# Patient Record
Sex: Female | Born: 1985 | Race: White | Hispanic: No | Marital: Married | Smoking: Never smoker
Health system: Southern US, Community
[De-identification: ages and names within clinical notes are randomized; demographics above are authoritative.]

## PROBLEM LIST (undated history)

## (undated) DIAGNOSIS — D069 Carcinoma in situ of cervix, unspecified: Secondary | ICD-10-CM

## (undated) HISTORY — DX: Carcinoma in situ of cervix, unspecified: D06.9

## (undated) HISTORY — PX: NO PAST SURGERIES: SHX2092

---
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
## 2007-04-15 ENCOUNTER — Emergency Department (HOSPITAL_COMMUNITY): Admission: EM | Admit: 2007-04-15 | Discharge: 2007-04-15 | Payer: Self-pay | Admitting: Family Medicine

## 2007-10-01 ENCOUNTER — Emergency Department: Payer: Self-pay | Admitting: Emergency Medicine

## 2007-10-01 ENCOUNTER — Other Ambulatory Visit: Payer: Self-pay

## 2008-07-29 ENCOUNTER — Emergency Department: Payer: Self-pay | Admitting: Emergency Medicine

## 2008-08-31 ENCOUNTER — Emergency Department: Payer: Self-pay | Admitting: Emergency Medicine

## 2008-11-15 ENCOUNTER — Emergency Department (HOSPITAL_COMMUNITY): Admission: EM | Admit: 2008-11-15 | Discharge: 2008-11-15 | Payer: Self-pay | Admitting: Family Medicine

## 2009-01-29 ENCOUNTER — Emergency Department (HOSPITAL_COMMUNITY): Admission: EM | Admit: 2009-01-29 | Discharge: 2009-01-30 | Payer: Self-pay | Admitting: Emergency Medicine

## 2009-05-30 ENCOUNTER — Inpatient Hospital Stay (HOSPITAL_COMMUNITY): Admission: AD | Admit: 2009-05-30 | Discharge: 2009-05-30 | Payer: Self-pay | Admitting: Obstetrics and Gynecology

## 2009-06-30 ENCOUNTER — Inpatient Hospital Stay (HOSPITAL_COMMUNITY): Admission: AD | Admit: 2009-06-30 | Discharge: 2009-07-02 | Payer: Self-pay | Admitting: Obstetrics and Gynecology

## 2009-07-30 ENCOUNTER — Emergency Department (HOSPITAL_COMMUNITY): Admission: EM | Admit: 2009-07-30 | Discharge: 2009-07-30 | Payer: Self-pay | Admitting: Family Medicine

## 2010-12-10 LAB — COMPREHENSIVE METABOLIC PANEL
AST: 23 U/L (ref 0–37)
BUN: 7 mg/dL (ref 6–23)
CO2: 24 mEq/L (ref 19–32)
Calcium: 9.4 mg/dL (ref 8.4–10.5)
Chloride: 105 mEq/L (ref 96–112)
Creatinine, Ser: 0.47 mg/dL (ref 0.4–1.2)
GFR calc Af Amer: >60 mL/min (ref >60)
GFR calc non Af Amer: >60 mL/min (ref >60)
Glucose, Bld: 98 mg/dL (ref 70–99)
Total Bilirubin: 0.7 mg/dL (ref 0.3–1.2)

## 2010-12-10 LAB — CBC
HCT: 37.2 % (ref 36.0–46.0)
MCHC: 33.4 g/dL (ref 30.0–36.0)
MCV: 88.6 fL (ref 78.0–100.0)
MCV: 88.8 fL (ref 78.0–100.0)
Platelets: 145 10*3/uL — ABNORMAL LOW (ref 150–400)
RBC: 3.17 MIL/uL — ABNORMAL LOW (ref 3.87–5.11)
RBC: 4.2 MIL/uL (ref 3.87–5.11)
WBC: 19.6 10*3/uL — ABNORMAL HIGH (ref 4.0–10.5)
WBC: 23 10*3/uL — ABNORMAL HIGH (ref 4.0–10.5)

## 2010-12-10 LAB — URIC ACID: Uric Acid, Serum: 4.4 mg/dL (ref 2.4–7.0)

## 2010-12-10 LAB — LACTATE DEHYDROGENASE: LDH: 99 U/L (ref 94–250)

## 2010-12-10 LAB — RPR: RPR Ser Ql: NONREACTIVE

## 2010-12-17 LAB — POCT PREGNANCY, URINE: Preg Test, Ur: POSITIVE

## 2011-05-22 ENCOUNTER — Inpatient Hospital Stay (INDEPENDENT_AMBULATORY_CARE_PROVIDER_SITE_OTHER)
Admission: RE | Admit: 2011-05-22 | Discharge: 2011-05-22 | Disposition: A | Discharge status: Home / Self Care | Payer: Self-pay | Source: Ambulatory Visit | Attending: Family Medicine | Admitting: Family Medicine

## 2011-05-22 DIAGNOSIS — N760 Acute vaginitis: Secondary | ICD-10-CM

## 2011-05-22 DIAGNOSIS — A499 Bacterial infection, unspecified: Secondary | ICD-10-CM

## 2011-05-22 DIAGNOSIS — N739 Female pelvic inflammatory disease, unspecified: Secondary | ICD-10-CM

## 2011-05-22 DIAGNOSIS — N390 Urinary tract infection, site not specified: Secondary | ICD-10-CM

## 2011-05-22 LAB — POCT URINALYSIS DIP (DEVICE)
Bilirubin Urine: NEGATIVE
Glucose, UA: NEGATIVE mg/dL
Ketones, ur: NEGATIVE mg/dL
Nitrite: NEGATIVE
pH: 7 (ref 5.0–8.0)

## 2011-05-22 LAB — WET PREP, GENITAL: Trich, Wet Prep: NONE SEEN

## 2011-05-22 LAB — POCT PREGNANCY, URINE: Preg Test, Ur: NEGATIVE

## 2011-05-23 LAB — URINE CULTURE: Culture: NO GROWTH

## 2011-05-24 LAB — GC/CHLAMYDIA PROBE AMP, GENITAL: GC Probe Amp, Genital: NEGATIVE

## 2011-06-21 LAB — POCT RAPID STREP A: Streptococcus, Group A Screen (Direct): NEGATIVE

## 2014-10-01 ENCOUNTER — Encounter (HOSPITAL_COMMUNITY): Payer: Self-pay | Admitting: *Deleted

## 2014-10-01 ENCOUNTER — Emergency Department (HOSPITAL_COMMUNITY)
Admission: EM | Admit: 2014-10-01 | Discharge: 2014-10-01 | Disposition: A | Discharge status: Home / Self Care | Payer: No Typology Code available for payment source | Source: Home / Self Care | Attending: Emergency Medicine | Admitting: Emergency Medicine

## 2014-10-01 DIAGNOSIS — B179 Acute viral hepatitis, unspecified: Secondary | ICD-10-CM

## 2014-10-01 DIAGNOSIS — K7200 Acute and subacute hepatic failure without coma: Secondary | ICD-10-CM

## 2014-10-01 LAB — CBC WITH DIFFERENTIAL/PLATELET
BASOS ABS: 0.2 10*3/uL — AB (ref 0.0–0.1)
BASOS PCT: 3 % — AB (ref 0–1)
EOS PCT: 1 % (ref 0–5)
Eosinophils Absolute: 0.1 10*3/uL (ref 0.0–0.7)
HCT: 32.5 % — ABNORMAL LOW (ref 36.0–46.0)
Hemoglobin: 11.1 g/dL — ABNORMAL LOW (ref 12.0–15.0)
Lymphocytes Relative: 51 % — ABNORMAL HIGH (ref 12–46)
Lymphs Abs: 3.6 10*3/uL (ref 0.7–4.0)
MCH: 27.8 pg (ref 26.0–34.0)
MCHC: 34.2 g/dL (ref 30.0–36.0)
MCV: 81.5 fL (ref 78.0–100.0)
MONOS PCT: 11 % (ref 3–12)
Monocytes Absolute: 0.8 10*3/uL (ref 0.1–1.0)
NEUTROS ABS: 2.4 10*3/uL (ref 1.7–7.7)
NEUTROS PCT: 34 % — AB (ref 43–77)
Platelets: 139 10*3/uL — ABNORMAL LOW (ref 150–400)
RBC: 3.99 MIL/uL (ref 3.87–5.11)
RDW: 14.6 % (ref 11.5–15.5)
WBC: 7.1 10*3/uL (ref 4.0–10.5)

## 2014-10-01 LAB — COMPREHENSIVE METABOLIC PANEL
ALK PHOS: 147 U/L — AB (ref 39–117)
ALT: 209 U/L — AB (ref 0–35)
ANION GAP: 12 (ref 5–15)
AST: 142 U/L — ABNORMAL HIGH (ref 0–37)
Albumin: 3.7 g/dL (ref 3.5–5.2)
BILIRUBIN TOTAL: 6.3 mg/dL — AB (ref 0.3–1.2)
BUN: 6 mg/dL (ref 6–23)
CO2: 26 mmol/L (ref 19–32)
CREATININE: 0.64 mg/dL (ref 0.50–1.10)
Calcium: 9.3 mg/dL (ref 8.4–10.5)
Chloride: 98 mmol/L (ref 96–112)
GFR calc Af Amer: >90 mL/min (ref >90)
Glucose, Bld: 90 mg/dL (ref 70–99)
POTASSIUM: 2.8 mmol/L — AB (ref 3.5–5.1)
SODIUM: 136 mmol/L (ref 135–145)
TOTAL PROTEIN: 7.5 g/dL (ref 6.0–8.3)

## 2014-10-01 LAB — POCT URINALYSIS DIP (DEVICE)
Glucose, UA: 100 mg/dL — AB
KETONES UR: 15 mg/dL — AB
Nitrite: NEGATIVE
PH: 5.5 (ref 5.0–8.0)
Protein, ur: 30 mg/dL — AB
SPECIFIC GRAVITY, URINE: 1.02 (ref 1.005–1.030)

## 2014-10-01 LAB — POCT INFECTIOUS MONO SCREEN: Mono Screen: NEGATIVE

## 2014-10-01 LAB — POCT PREGNANCY, URINE: PREG TEST UR: NEGATIVE

## 2014-10-01 NOTE — Discharge Instructions (Signed)
Viral Hepatitis  Hepatitis is an inflammation of the liver. It can be caused by many different things, including several different viruses. These viruses are named hepatitis A, B, C, D, and E. All these viruses can cause short-term (acute) hepatitis. The hepatitis B, C, and D viruses can also cause long-standing (chronic) hepatitis. Chronic hepatitis infection is prolonged and sometimes lifelong. Other viruses may also cause hepatitis but have not yet been discovered.  SYMPTOMS  Some people have no symptoms. Others may have:  · Fatigue.  · Abdominal pain.  · Loss of appetite.  · Nausea.  · Vomiting.  · Low-grade fever.  · Yellowing of the skin (jaundice).  HEPATITIS A  Disease Spread  Primarily through food or water contaminated by feces from an infected person.  People at Risk  · Travelers to any area of the world with poor sanitation.  · People living in areas where hepatitis A outbreaks are common.  · People who live with or have close contact with an infected person.  · During outbreaks:  ¨ Daycare children and employees.  ¨ Men who have sex with men.  ¨ Injection drug users.  ¨ People who eat raw or undercooked shellfish (oysters, clams, mussels).  ¨ People who live or work in institutions.  Prevention  · Receive the hepatitis A vaccine.  · Avoid tap water, ice cubes made from tap water, and uncooked or inadequately cooked foods when traveling to areas with poor sanitation.  · Avoid eating raw or undercooked shellfish.  · Practice good hygiene and sanitation.  Treatment  Hepatitis A usually resolves on its own over several weeks.  HEPATITIS B  Disease Spread  · Through contact with infected blood.  · Through sex with an infected person.  · From mother to child during childbirth.  People at Risk  · People who have sex with an infected person.  · Men who have sex with men.  · Injection drug users.  · Children of immigrants from disease-endemic areas.  · Infants born to infected mothers.  · People who live with an  infected person and are exposed to that person's blood.  · Healthcare workers exposed to blood.  · Hemodialysis patients.  · People who received a transfusion of blood or blood products before July 1992 or clotting factors made before 1987.  Prevention  · Receive the hepatitis B vaccine.  · Healthcare workers need to avoid injuries and wear appropriate protective equipment such as gloves, gowns, and face masks when performing invasive medical or nursing procedures.  · After exposure to infectious blood, if you were not previously and successfully vaccinated, receive a gamma globulin shot (HBIG), hepatitis B vaccine, or both.  Treatment  Acute hepatitis B usually resolves on its own. For chronic hepatitis B, drug treatment is available and advised for many, but not all patients. All patients who have chronic hepatitis B infection should be carefully monitored by a caregiver over time.   HEPATITIS C  Disease Spread  · Through contact with infected blood.  · Through sex with an infected person.  · From mother to child during childbirth.  People at Risk  · Injection drug users.  · People who have sex with an infected person.  · People who have multiple sex partners.  · Healthcare workers exposed to blood.  · Infants born to infected mothers.  · Hemodialysis patients.  · People who received a transfusion of blood or blood products before July 1992 or clotting factors made   before 1987.  · People born between 1945 and 1965.  Prevention  · There is no vaccine for hepatitis C. The only way to prevent the disease is to reduce the risk of exposure to blood that is contaminated with the virus. This means avoiding behaviors like sharing drug needles or sharing personal items like toothbrushes, razors, and nail clippers with an infected person.  · Healthcare workers need to avoid injuries and wear appropriate protective equipment, such as gloves, gowns, and face masks when performing invasive medical or nursing  procedures.  Treatment  For acute hepatitis C, treatment may be recommended if it does not resolve within 2 to 3 months. For chronic hepatitis C, drug treatment is available and advised for many, but not all patients. All patients who have chronic hepatitis C infection should be carefully monitored by a caregiver over time.  HEPATITIS D  Disease Spread  Through contact with infected blood. This disease happens only in people who are already infected with hepatitis B or who become infected with hepatitis B and hepatitis D at the same time.  People at Risk  Anyone infected with hepatitis B. Injection drug users who have hepatitis B have the highest risk. People who have hepatitis B are also at risk if they have sex with a person infected with hepatitis D or if they live with an infected person. Also at risk are people who received a transfusion of blood or blood products before July 1992 or clotting factors made before 1987.  Prevention  · Receive the hepatitis B vaccine if you are not already infected.  · Avoid exposure to infected blood.  · Avoid exposure to contaminated needles.  · Avoid exposure to an infected person's personal items (toothbrush, razor, nail clippers).  Treatment  The combination of hepatitis D and hepatitis B infection usually causes very serious and progressive liver disease. Because of this, drug treatment is almost always recommended. Treatment regimens are the same as those recommended for the hepatitis B infection.  HEPATITIS E  Disease Spread  Through food or water contaminated by feces from an infected person. This disease is common in Asia, Africa, the Middle [place], and [place].   People at Risk  · Travelers to areas of the world where hepatitis E infection is common.  · People living in areas where hepatitis E outbreaks are common.  · People who live with an infected person.  Prevention  A vaccine for hepatitis E is not yet available. Currently, the only way to prevent the disease  is to reduce the risk of exposure to the virus. This means avoiding tap water, ice cubes made from tap water, and uncooked or inadequately cooked foods when traveling to hepatitis E endemic areas with poor sanitation.   Treatment  Hepatitis E usually resolves on its own over several weeks to months.  OTHER CAUSES OF VIRAL HEPATITIS  Some cases of viral hepatitis cannot be attributed to the hepatitis A, B, C, D, or E viruses. This is called non A-E hepatitis. Scientists continue to study the causes of non A-E hepatitis.  Document Released: 10/15/2004 Document Revised: 11/15/2011 Document Reviewed: 12/24/2010  ExitCare® Patient Information ©2015 [employer]. This information is not intended to replace advice given to you by your health care provider. Make sure you discuss any questions you have with your health care provider.
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
## 2014-10-01 NOTE — ED Provider Notes (Signed)
Chief Complaint   Urinary Tract Infection   History of Present Illness   Stephania FragminKasey L [name] is a [age] year old female who presents with lower back and lower abdominal pain with nausea since yesterday. Her urine is tea-colored right now has gotten progressively darker over the past week. She denies any dysuria, frequency, or urgency. She's had no fever, chills, sore throat, swollen glands, vomiting, or change in color of her stools. She denies any sick exposures. Denies any ingestion of raw foods. She works as a LawyerCNA. She's not gotten any needle sticks or bodily fluid exposures recently. She is sexually active. Her boyfriend has not been sick. No recent foreign travel. She has gotten her hepatitis B vaccines.  Review of Systems   Other than as noted above, the patient denies any of the following symptoms: Constitutional:  No fever, chills, weight loss or anorexia. Abdomen:  No nausea, vomiting, hematememesis, melena, diarrhea, or hematochezia. GU:  No dysuria, frequency, urgency, or hematuria. Gyn:  No vaginal discharge, itching, abnormal bleeding, dyspareunia, or pelvic pain.  PMFSH   Past medical history, family history, social history, meds, and allergies were reviewed. She has been on birth control pills since late November.  Physical Exam     Vital signs:  BP 108/64 mmHg  Pulse 105  Temp(Src) 99.7 F (37.6 C) (Oral)  Resp 18  SpO2 98%  LMP 09/19/2014 Gen:  Alert, oriented, in no distress. Eyes: Sclerae are icteric. ENT: Pharynx is clear, no cervical adenopathy. Lungs:  Breath sounds clear and equal bilaterally.  No wheezes, rales or rhonchi. Heart:  Regular rhythm.  No gallops or murmers.   Abdomen:  Soft, nontender, no organomegaly or mass. Bowel sounds are normally active. Skin:  Clear, warm and dry.  No rash.  Labs   Results for orders placed or performed during the hospital encounter of 10/01/14  POCT urinalysis dip (device)  Result Value Ref Range   Glucose, UA 100 (A)  NEGATIVE mg/dL   Bilirubin Urine LARGE (A) NEGATIVE   Ketones, ur 15 (A) NEGATIVE mg/dL   Specific Gravity, Urine 1.020 1.005 - 1.030   Hgb urine dipstick TRACE (A) NEGATIVE   pH 5.5 5.0 - 8.0   Protein, ur 30 (A) NEGATIVE mg/dL   Urobilinogen, UA >=7.4>=8.0 0.0 - 1.0 mg/dL   Nitrite NEGATIVE NEGATIVE   Leukocytes, UA TRACE (A) NEGATIVE  Pregnancy, urine POC  Result Value Ref Range   Preg Test, Ur NEGATIVE NEGATIVE   Acute hepatitis serologies obtained as well as complete metabolic panel, CBC, HIV, RPR, Monospot, and urine culture. Will call patient back with results as they come in.  Assessment   The encounter diagnosis was Acute hepatitis.  Nothing to suggest urinary tract infection. Only has trace RBCs and leukocytes on her urine. Also no urinary symptoms. She has nothing to suggest mononucleosis. She's been immunized to hepatitis B, so she should not have that. No suspicious exposures for hepatitis C. Could be hepatitis A, or possibly drug-related, secondary to birth control pills. We'll see what the results of her blood work shows, have her come back again on Friday, and thereafter will need to see gastroenterology for long-term follow-up. Was told to stop birth control pills in the meantime.  Plan     1.  Meds:  The following meds were prescribed:   New Prescriptions   No medications on file    2.  Patient Education/Counseling:  The patient was given appropriate handouts, self care instructions, and instructed in symptomatic  relief.  Suggested she take infectious precautions until we know what type of hepatitis were dealing with.  3.  Follow up:  The patient was told to follow up here for a scheduled recheck in 3 days, or sooner if becoming worse in any way, and given some red flag symptoms such as worsening pain, fever, vomiting, or evidence of GI bleeding which would prompt immediate return. After her follow-up here, she'll need to see gastroenterology for long-term  follow-up.    Reuben Likes, MD 10/01/14 2001

## 2014-10-01 NOTE — ED Notes (Signed)
C/o low back and low abdominal pain and nausea onset yesterday.  Urine tea colored now- got progressively darker over the past week.  No frequency in small amounts.  No dysuria or urgency.

## 2014-10-02 ENCOUNTER — Telehealth (HOSPITAL_COMMUNITY): Payer: Self-pay | Admitting: Emergency Medicine

## 2014-10-02 LAB — HEPATITIS PANEL, ACUTE
HCV Ab: NEGATIVE
HEP A IGM: NONREACTIVE
HEP B S AG: NEGATIVE
Hep B C IgM: NONREACTIVE

## 2014-10-02 LAB — PATHOLOGIST SMEAR REVIEW

## 2014-10-02 MED ORDER — POTASSIUM CHLORIDE CRYS ER 20 MEQ PO TBCR: 20.0000 meq | EXTENDED_RELEASE_TABLET | Freq: Two times a day (BID) | ORAL | Status: DC

## 2014-10-02 NOTE — ED Notes (Signed)
Her LFTs were elevated, K was 2.8, and hepatitis serologies were negative.  Patient was informed of these results.  Suggested she remain off of BCPs, follow up on Friday, and get extra dietary K.  Will send in Rx for K-Dur.  Reuben Likesavid C Calie Buttrey, MD 10/02/14 [phone]

## 2014-10-03 ENCOUNTER — Telehealth (HOSPITAL_COMMUNITY): Payer: Self-pay | Admitting: *Deleted

## 2014-10-03 ENCOUNTER — Emergency Department (HOSPITAL_COMMUNITY)
Admission: EM | Admit: 2014-10-03 | Discharge: 2014-10-03 | Disposition: A | Discharge status: Home / Self Care | Payer: No Typology Code available for payment source | Attending: Emergency Medicine | Admitting: Emergency Medicine

## 2014-10-03 ENCOUNTER — Emergency Department (HOSPITAL_COMMUNITY): Payer: No Typology Code available for payment source

## 2014-10-03 ENCOUNTER — Encounter (HOSPITAL_COMMUNITY): Payer: Self-pay | Admitting: Emergency Medicine

## 2014-10-03 DIAGNOSIS — R7401 Elevation of levels of liver transaminase levels: Secondary | ICD-10-CM

## 2014-10-03 DIAGNOSIS — R740 Nonspecific elevation of levels of transaminase and lactic acid dehydrogenase [LDH]: Secondary | ICD-10-CM | POA: Insufficient documentation

## 2014-10-03 DIAGNOSIS — Z79899 Other long term (current) drug therapy: Secondary | ICD-10-CM | POA: Insufficient documentation

## 2014-10-03 DIAGNOSIS — Z3202 Encounter for pregnancy test, result negative: Secondary | ICD-10-CM | POA: Diagnosis not present

## 2014-10-03 DIAGNOSIS — R17 Unspecified jaundice: Secondary | ICD-10-CM | POA: Diagnosis not present

## 2014-10-03 LAB — URINALYSIS, ROUTINE W REFLEX MICROSCOPIC
GLUCOSE, UA: NEGATIVE mg/dL
KETONES UR: 15 mg/dL — AB
Nitrite: NEGATIVE
Protein, ur: NEGATIVE mg/dL
SPECIFIC GRAVITY, URINE: 1.014 (ref 1.005–1.030)
Urobilinogen, UA: >8 mg/dL — ABNORMAL HIGH (ref 0.0–1.0)
pH: 5.5 (ref 5.0–8.0)

## 2014-10-03 LAB — COMPREHENSIVE METABOLIC PANEL
ALBUMIN: 3.6 g/dL (ref 3.5–5.2)
ALK PHOS: 154 U/L — AB (ref 39–117)
ALT: 235 U/L — AB (ref 0–35)
ANION GAP: 9 (ref 5–15)
AST: 148 U/L — AB (ref 0–37)
BUN: 6 mg/dL (ref 6–23)
CHLORIDE: 100 mmol/L (ref 96–112)
CO2: 27 mmol/L (ref 19–32)
Calcium: 9.2 mg/dL (ref 8.4–10.5)
Creatinine, Ser: 0.55 mg/dL (ref 0.50–1.10)
GFR calc Af Amer: >90 mL/min (ref >90)
GLUCOSE: 114 mg/dL — AB (ref 70–99)
POTASSIUM: 4 mmol/L (ref 3.5–5.1)
SODIUM: 136 mmol/L (ref 135–145)
Total Bilirubin: 5.2 mg/dL — ABNORMAL HIGH (ref 0.3–1.2)
Total Protein: 7.6 g/dL (ref 6.0–8.3)

## 2014-10-03 LAB — URINE CULTURE
COLONY COUNT: NO GROWTH
Culture: NO GROWTH
Special Requests: NORMAL

## 2014-10-03 LAB — CBC WITH DIFFERENTIAL/PLATELET
BASOS PCT: 4 % — AB (ref 0–1)
Basophils Absolute: 0.3 10*3/uL — ABNORMAL HIGH (ref 0.0–0.1)
Eosinophils Absolute: 0 10*3/uL (ref 0.0–0.7)
Eosinophils Relative: 0 % (ref 0–5)
HEMATOCRIT: 34.9 % — AB (ref 36.0–46.0)
Hemoglobin: 11.8 g/dL — ABNORMAL LOW (ref 12.0–15.0)
LYMPHS ABS: 4.9 10*3/uL — AB (ref 0.7–4.0)
Lymphocytes Relative: 68 % — ABNORMAL HIGH (ref 12–46)
MCH: 28.3 pg (ref 26.0–34.0)
MCHC: 33.8 g/dL (ref 30.0–36.0)
MCV: 83.7 fL (ref 78.0–100.0)
MONOS PCT: 9 % (ref 3–12)
Monocytes Absolute: 0.6 10*3/uL (ref 0.1–1.0)
Neutro Abs: 1.3 10*3/uL — ABNORMAL LOW (ref 1.7–7.7)
Neutrophils Relative %: 19 % — ABNORMAL LOW (ref 43–77)
PLATELETS: 152 10*3/uL (ref 150–400)
RBC: 4.17 MIL/uL (ref 3.87–5.11)
RDW: 14.8 % (ref 11.5–15.5)
WBC: 7.1 10*3/uL (ref 4.0–10.5)

## 2014-10-03 LAB — URINE MICROSCOPIC-ADD ON

## 2014-10-03 LAB — PROTIME-INR
INR: 0.95 (ref 0.00–1.49)
PROTHROMBIN TIME: 12.8 s (ref 11.6–15.2)

## 2014-10-03 LAB — CK TOTAL AND CKMB (NOT AT ARMC)
CK TOTAL: 29 U/L (ref 7–177)
CK, MB: 0.4 ng/mL (ref 0.3–4.0)
RELATIVE INDEX: INVALID (ref 0.0–2.5)

## 2014-10-03 LAB — POC URINE PREG, ED: PREG TEST UR: NEGATIVE

## 2014-10-03 NOTE — Discharge Instructions (Signed)
Please call tomorrow morning for an appointment [facility] for the Reasons we discussed (worsening abdominal pain, inability to hold down foods or fluids, altered mental status) Please avoid drinking any alcohol or using Tylenol. During this time.  Jaundice Jaundice is a yellowish discoloration of the skin, whites of the eyes, and mucous membranes. It is caused by increased levels of bilirubin in the blood (hyperbilirubinemia). Bilirubin is produced by the normal breakdown of red blood cells. Jaundice may mean the liver or bile system is not working normally. CAUSES  The most common causes include:  Viral hepatitis.  Gallstones.  Excess use of alcohol.  Liver disease.  Certain cancers. SYMPTOMS   Yellow color to the skin, whites of the eyes, or mucous membranes.  Dark brown colored urine.  Stomach pain.  Light or clay colored stool.  Itchy skin. DIAGNOSIS   Your history will be taken along with a physical exam.  Urine and blood tests.  Abdominal ultrasound.  CT scans.  MRI.  Liver biopsy if the liver disease is suspected.  Endoscopic retrograde cholangiopancreatography (ERCP). TREATMENT  Treatment depends on the cause or related to the treatment of an underlying condition. For example, if jaundice is caused by gallstones, the stones or gallbladder may need to be removed. Other treatments may include:  Rest.  Stopping a certain medicine if it is causing the jaundice.  Giving fluid through the vein (IV fluids).  Surgery (removing gallstones, cancers). Some conditions that cause jaundice can be fatal if not treated. HOME CARE INSTRUCTIONS   Rest.  Drink enough fluids to keep your urine clear or pale yellow.  Avoid all alcoholic drinks.  Only take over-the-counter or prescription medicines for nausea, vomiting, itching, pain, discomfort, or fever as directed by your caregiver.  If jaundice is due to viral hepatitis or an  infection:  Avoid close contact with people.  Avoid preparing food for others.  Avoid sharing utensils with others.  Wash your hands often.  Keep all follow-up appointments with your caregiver.  Use skin lotions to relieve itching. SEEK IMMEDIATE MEDICAL CARE IF:   You have increased pain.  You have repeated vomiting.  You become dehydrated.  You have a fever or persistent symptoms for more than 72 hours.  You have a fever and your symptoms suddenly get worse.  You become weak or confused.  You develop a severe headache. MAKE SURE YOU:   Understand these instructions.  Will watch your condition.  Will get help right away if you are not doing well or get worse. Document Released: 08/23/2005 Document Revised: 11/15/2011 Document Reviewed: 08/07/2010 [facility] Patient Information 2015 [employer] [place]. This information is not intended to replace advice given to you by your health care provider. Make sure you discuss any questions you have with your health care provider.

## 2014-10-03 NOTE — ED Notes (Signed)
Pt sts increasing jaundice; pt denies other complaint at present; [facility]

## 2014-10-03 NOTE — ED Notes (Signed)
Pt states that she started a new birth control at the end of November. States that the Dr at urgent care believes the jaundice is from the birth control and instructed her to stop taking it.
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
## 2014-10-03 NOTE — ED Provider Notes (Signed)
CSN: [id number]     Arrival date & time 10/03/14  1743 History   First MD Initiated Contact with Patient 10/03/14 1820     Chief Complaint  Patient presents with  . Jaundice     (Consider location/radiation/quality/duration/timing/severity/associated sxs/prior Treatment) HPI  This is a 29 year old female who presents emergency department for jaundice. Patient was seen at the urgent care on 10/01/2014. She was found to have elevated LFTs, elevated urobilinogen in her urine, and potassium of 2.8.  Patient was asked to discontinue her oral contraceptive pills. She was brought in by her family members tonight because of worsening jaundice. She states her symptoms began one week ago when her urine turned dark. 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 The patient states that this past Tuesday she went to work all day and when she got off states that she just felt generalized malaise, body aches, abdominal pain and nausea. She denies any vomiting, change in stool colors, diarrhea. Her abdominal pain was in the left lower quadrant that has resolved. The patient has no known risk factors for hepatitis and her hepatitis panels were negative. She denies any current abdominal pain or alcohol abuse. Denies fevers, chills, myalgias, arthralgias. Denies DOE, SOB, chest tightness or pressure, radiation to left arm, jaw or back, or diaphoresis. Denies dysuria, flank pain, suprapubic pain, frequency, urgency. Denies headaches, light headedness, weakness, visual disturbances. Denies abdominal pain, nausea, vomiting, diarrhea or constipation.   History reviewed. No pertinent past medical history. History reviewed. No pertinent past surgical history. History reviewed. No pertinent family history. History  Substance Use Topics  . Smoking status: Never Smoker   . Smokeless tobacco: Not on file  . Alcohol Use: Yes     Comment:  occasional   OB History    No data available     Review of Systems  Ten systems reviewed and are negative for acute change, except as noted in the HPI.    Allergies  Review of patient's allergies indicates no known allergies.  Home Medications   Prior to Admission medications   Medication Sig Start Date End Date Taking? Authorizing Provider  potassium chloride SA (K-DUR,KLOR-CON) 20 MEQ tablet Take 1 tablet (20 mEq total) by mouth 2 (two) times daily. 10/02/14   Reuben Likes, MD   BP 117/69 mmHg  Pulse 84  Temp(Src) 98.3 F (36.8 C) (Oral)  Resp 16  SpO2 99%  LMP 09/19/2014 Physical Exam  Constitutional: She is oriented to person, place, and time. She appears well-developed and well-nourished. No distress.  HENT:  Head: Normocephalic and atraumatic.  Eyes: Conjunctivae are normal. Scleral icterus is present.  Neck: Normal range of motion.  Cardiovascular: Normal rate, regular rhythm and normal heart sounds.  Exam reveals no gallop and no friction rub.   No murmur heard. Pulmonary/Chest: Effort normal and breath sounds normal. No respiratory distress.  Abdominal: Soft. Bowel sounds are normal. She exhibits no distension and no mass. There is no tenderness. There is no guarding.  Neurological: She is alert and oriented to person, place, and time.  Skin: Skin is warm and dry. She is not diaphoretic.  Jaundiced skin    ED Course  Procedures (including critical care time) Labs Review Labs Reviewed  CBC WITH DIFFERENTIAL/PLATELET - Abnormal; Notable for the following:    Hemoglobin 11.8 (*)    HCT 34.9 (*)    Neutrophils Relative % 19 (*)  Lymphocytes Relative 68 (*)    Basophils Relative 4 (*)    Neutro Abs 1.3 (*)    Lymphs Abs 4.9 (*)    Basophils Absolute 0.3 (*)    All other components within normal limits  COMPREHENSIVE METABOLIC PANEL - Abnormal; Notable for the following:    Glucose, Bld 114 (*)    AST 148 (*)    ALT 235 (*)    Alkaline Phosphatase 154  (*)    Total Bilirubin 5.2 (*)    All other components within normal limits  URINALYSIS, ROUTINE W REFLEX MICROSCOPIC - Abnormal; Notable for the following:    Color, Urine ORANGE (*)    Hgb urine dipstick MODERATE (*)    Bilirubin Urine LARGE (*)    Ketones, ur 15 (*)    Urobilinogen, UA >8.0 (*)    Leukocytes, UA SMALL (*)    All other components within normal limits  CK TOTAL AND CKMB  URINE MICROSCOPIC-ADD ON  PROTIME-INR  POC URINE PREG, ED    Imaging Review No results found.   EKG Interpretation None      MDM   Final diagnoses:  Jaundice  Transaminitis     Patient here with jaundice, known elevated liver enzymes. Will reorder lab tests. Will repeat lab work and evaluate for worsening liver enzymes. RUQ US ordered. Negative hep panel. DDX includes choledocholithiasis, mass lesion, autoimmune hepatitis. Patient is without pain or nausea at this time.    Patient with bilirubinuria, negative ABD US. Her liver enzymes appears stable. PT/INR normal.  I have spoken with Dr. Arlyce DiceKaplan who will see the patient in follow up in his office. She will have repeat testing and further work up. No emergent MRCP needed. I have discussed lab/imaging findings and discharge plan with the patient as well as reasons to return to the ED.  The patient appears reasonably screened and/or stabilized for discharge and I doubt any other medical condition or other [facility] requiring further screening, evaluation, or treatment in the ED at this time prior to discharge.   Arthor Captainbigail Arick Mareno, PA-C 10/14/14 1100  Richardean Canalavid H Yao, MD 10/15/14 [phone]

## 2014-10-04 ENCOUNTER — Encounter (HOSPITAL_COMMUNITY): Payer: Self-pay

## 2014-10-04 ENCOUNTER — Emergency Department (HOSPITAL_COMMUNITY)
Admission: EM | Admit: 2014-10-04 | Discharge: 2014-10-04 | Disposition: A | Discharge status: Home / Self Care | Payer: No Typology Code available for payment source | Attending: Emergency Medicine | Admitting: Emergency Medicine

## 2014-10-04 DIAGNOSIS — N939 Abnormal uterine and vaginal bleeding, unspecified: Secondary | ICD-10-CM

## 2014-10-04 DIAGNOSIS — K759 Inflammatory liver disease, unspecified: Secondary | ICD-10-CM | POA: Diagnosis not present

## 2014-10-04 DIAGNOSIS — Z79899 Other long term (current) drug therapy: Secondary | ICD-10-CM | POA: Insufficient documentation

## 2014-10-04 DIAGNOSIS — N938 Other specified abnormal uterine and vaginal bleeding: Secondary | ICD-10-CM | POA: Diagnosis present

## 2014-10-04 LAB — COMPREHENSIVE METABOLIC PANEL
ALBUMIN: 3.5 g/dL (ref 3.5–5.2)
ALT: 251 U/L — ABNORMAL HIGH (ref 0–35)
AST: 152 U/L — ABNORMAL HIGH (ref 0–37)
Alkaline Phosphatase: 161 U/L — ABNORMAL HIGH (ref 39–117)
Anion gap: 4 — ABNORMAL LOW (ref 5–15)
BILIRUBIN TOTAL: 4.2 mg/dL — AB (ref 0.3–1.2)
BUN: 6 mg/dL (ref 6–23)
CALCIUM: 9.5 mg/dL (ref 8.4–10.5)
CO2: 31 mmol/L (ref 19–32)
Chloride: 102 mmol/L (ref 96–112)
Creatinine, Ser: 0.66 mg/dL (ref 0.50–1.10)
GFR calc Af Amer: >90 mL/min (ref >90)
GFR calc non Af Amer: >90 mL/min (ref >90)
GLUCOSE: 132 mg/dL — AB (ref 70–99)
Potassium: 4.2 mmol/L (ref 3.5–5.1)
SODIUM: 137 mmol/L (ref 135–145)
TOTAL PROTEIN: 7.4 g/dL (ref 6.0–8.3)

## 2014-10-04 LAB — RPR: RPR: NONREACTIVE

## 2014-10-04 LAB — CBC
HCT: 35.2 % — ABNORMAL LOW (ref 36.0–46.0)
HEMOGLOBIN: 11.7 g/dL — AB (ref 12.0–15.0)
MCH: 27.7 pg (ref 26.0–34.0)
MCHC: 33.2 g/dL (ref 30.0–36.0)
MCV: 83.2 fL (ref 78.0–100.0)
Platelets: 155 10*3/uL (ref 150–400)
RBC: 4.23 MIL/uL (ref 3.87–5.11)
RDW: 15.3 % (ref 11.5–15.5)
WBC: 7.3 10*3/uL (ref 4.0–10.5)

## 2014-10-04 LAB — ABO/RH: ABO/RH(D): B POS

## 2014-10-04 LAB — TYPE AND SCREEN
ABO/RH(D): B POS
Antibody Screen: NEGATIVE

## 2014-10-04 LAB — HIV ANTIBODY (ROUTINE TESTING W REFLEX): HIV Screen 4th Generation wRfx: NONREACTIVE

## 2014-10-04 NOTE — ED Notes (Signed)
Pt comfortable with discharge and follow up instructions. Pt declines wheelchair, escorted to waiting area by this RN. No prescriptions. 

## 2014-10-04 NOTE — ED Notes (Signed)
Pt was seen last night for vaginal bleeding and today noticed some blood with her stool. Has pain to left lower abd.

## 2014-10-04 NOTE — ED Provider Notes (Signed)
CSN: [id number]     Arrival date & time 10/04/14  1439 History   First MD Initiated Contact with Patient 10/04/14 1653     Chief Complaint  Patient presents with  . Vaginal Bleeding     HPI Patient presents with vaginal bleeding which started the last 24 hours.  Recently been seen and diagnosed with elevated LFTs.  She has been point sig gastroenterologist on Monday.  Patient was supposed to stop her birth control pills 2 days ago which she did.  Patient denies any nausea vomiting or diarrhea.  Patient does have jaundice and icterus but this was present yesterday during her visit.  She has had no recent needle stick.  Has not had recent use of Tylenol.  Patient did have hepatitis panels done recently which were negative.  No one else in her family is ill.  Patient states her bleeding started yesterday and feels like a normal period. History reviewed. No pertinent past medical history. History reviewed. No pertinent past surgical history. No family history on file. History  Substance Use Topics  . Smoking status: Never Smoker   . Smokeless tobacco: Not on file  . Alcohol Use: Yes     Comment: occasional   OB History    No data available     Review of Systems All other systems reviewed and are negative   Allergies  Review of patient's allergies indicates no known allergies.  Home Medications   Prior to Admission medications   Medication Sig Start Date End Date Taking? Authorizing Provider  potassium chloride SA (K-DUR,KLOR-CON) 20 MEQ tablet Take 1 tablet (20 mEq total) by mouth 2 (two) times daily. 10/02/14   Reuben Likesavid C Keller, MD   BP 102/61 mmHg  Pulse 95  Temp(Src) 98 F (36.7 C)  Resp 16  SpO2 100%  LMP 09/19/2014 Physical Exam  Constitutional: She is oriented to person, place, and time. She appears well-developed and well-nourished. No distress.  HENT:  Head: Normocephalic and atraumatic.  Eyes: Pupils are equal, round, and reactive to light.  Neck: Normal range of  motion.  Cardiovascular: Normal rate and intact distal pulses.   Pulmonary/Chest: No respiratory distress.  Abdominal: Normal appearance. She exhibits no distension.  Genitourinary: There is bleeding in the vagina. No tenderness in the vagina. No vaginal discharge found.  Musculoskeletal: Normal range of motion.  Neurological: She is alert and oriented to person, place, and time. No cranial nerve deficit.  Skin: Skin is warm and dry. No rash noted.  Psychiatric: She has a normal mood and affect. Her behavior is normal.  Nursing note and vitals reviewed.   ED Course  Procedures (including critical care time) Labs Review Labs Reviewed  CBC - Abnormal; Notable for the following:    Hemoglobin 11.7 (*)    HCT 35.2 (*)    All other components within normal limits  COMPREHENSIVE METABOLIC PANEL - Abnormal; Notable for the following:    Glucose, Bld 132 (*)    AST 152 (*)    ALT 251 (*)    Alkaline Phosphatase 161 (*)    Total Bilirubin 4.2 (*)    Anion gap 4 (*)    All other components within normal limits  TYPE AND SCREEN  ABO/RH    Imaging Review [place] Abdomen Complete  10/03/2014   CLINICAL DATA:  Jaundice  EXAM: ULTRASOUND ABDOMEN COMPLETE  COMPARISON:  None.  FINDINGS: Gallbladder: No gallstones or wall thickening visualized. No sonographic Murphy sign noted.  Common bile duct: Diameter:  2.8 mm  Liver: No focal lesion identified. Within normal limits in parenchymal echogenicity.  IVC: No abnormality visualized.  Pancreas: Visualized portion unremarkable.  Spleen: There is a calcified cyst within the spleen measuring 4.2 cm. The spleen is upper limits of normal in size with a craniocaudal measurement of 16.8 cm and volume of 711 ml  Right Kidney: Length: 12.2 cm. Echogenicity within normal limits. No mass or hydronephrosis visualized.  Left Kidney: Length: 11.7 cm. Echogenicity within normal limits. No mass or hydronephrosis visualized.  Abdominal aorta: No aneurysm visualized.  Other  findings: No ascites.  IMPRESSION: Upper normal spleen size.  4 cm splenic cyst.   Electronically Signed   By: Ellery Plunk M.D.   On: 10/03/2014 21:16    Her onset of vaginal bleeding coincides with her sudden stopping of her birth control pills.  The cause of her elevated LFTs is still uncertain.  At this time she is stable to go home.  She had minimal vaginal bleeding in her vaginal vault.  Quantitative amount similar to what I normal menstrual cycle would be.  Patient will follow-up with the gastroenterologist as scheduled next week.  MDM   Final diagnoses:  Vaginal bleeding  Hepatitis        Nelia Shi, MD 10/04/14 Rickey Primus

## 2015-03-20 LAB — OB RESULTS CONSOLE ABO/RH: RH Type: POSITIVE

## 2015-03-20 LAB — OB RESULTS CONSOLE GC/CHLAMYDIA
Chlamydia: NEGATIVE
Chlamydia: POSITIVE
GC PROBE AMP, GENITAL: NEGATIVE

## 2015-03-20 LAB — OB RESULTS CONSOLE HIV ANTIBODY (ROUTINE TESTING): HIV: NONREACTIVE

## 2015-03-20 LAB — OB RESULTS CONSOLE RPR: RPR: NONREACTIVE

## 2015-03-20 LAB — OB RESULTS CONSOLE HEPATITIS B SURFACE ANTIGEN: HEP B S AG: NEGATIVE

## 2015-03-20 LAB — OB RESULTS CONSOLE ANTIBODY SCREEN: ANTIBODY SCREEN: NEGATIVE

## 2015-03-20 LAB — OB RESULTS CONSOLE RUBELLA ANTIBODY, IGM: Rubella: NON-IMMUNE/NOT IMMUNE

## 2015-05-05 ENCOUNTER — Ambulatory Visit: Payer: No Typology Code available for payment source | Admitting: Gynecologic Oncology

## 2015-05-16 ENCOUNTER — Ambulatory Visit: Payer: No Typology Code available for payment source | Admitting: Gynecologic Oncology

## 2015-05-21 ENCOUNTER — Telehealth: Payer: Self-pay | Admitting: *Deleted

## 2015-05-21 ENCOUNTER — Telehealth: Payer: Self-pay | Admitting: Nurse Practitioner

## 2015-05-21 NOTE — Telephone Encounter (Signed)
RN informed Shanda Bumps at Dr. Ebony Hail office of 2 missed appts with Dr. Andrey Farmer. She will pass on to provider. RN requested that she let us know how we can assist in caring for this patient. She will call us back as necessary.

## 2015-05-21 NOTE — Telephone Encounter (Signed)
Received a call from Dr. Ambrose Mantle stating " Patient was not aware of her appointment and our office had the wrong contact number. Dr. Ambrose Mantle was informed that patient called and cancelled her first appointment due to her work schedule. Pt was rescheduled at the time of cancellation, but was a no-show for the second appointment. Dr. Ambrose Mantle request that patient be given a third appointment and supplied a new contact number. Pt was called and appointment was scheduled for 06/05/15 @ 3:00pm with Dr. Andrey Farmer. Pt was advised to arrive 30 min early to register.

## 2015-06-05 ENCOUNTER — Ambulatory Visit: Payer: Managed Care, Other (non HMO) | Attending: Gynecologic Oncology | Admitting: Gynecologic Oncology

## 2015-06-05 ENCOUNTER — Encounter: Payer: Self-pay | Admitting: Gynecologic Oncology

## 2015-06-05 VITALS — BP 115/62 | HR 75 | Temp 98.2°F | Resp 18 | Ht 65.5 in | Wt 130.8 lb

## 2015-06-05 DIAGNOSIS — D069 Carcinoma in situ of cervix, unspecified: Secondary | ICD-10-CM | POA: Diagnosis not present

## 2015-06-05 NOTE — Patient Instructions (Signed)
We will call you with the results of the biopsy from today. Please call our office with any questions or concerns.

## 2015-06-05 NOTE — Progress Notes (Signed)
Consult Note: Gyn-Onc  Consult was requested by Dr. Ambrose Mantle for the evaluation of Brittany [name] 29 y.o. female  CC:  Chief Complaint  Patient presents with  . Cervical intraepithelial neoplasia III (CIN III)    New consult    Assessment/Plan:  Brittany [name]  is a 29 y.o.  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 Colposcopy looks consistent with CIN3. Recommend repeating colposcopic exam in office in 3rd trimester (biopsy only if new exam findings).  Otherwise, plan is for excisional procedure (LEEP or cone) at 6 weeks postpartum. There is no need for repeat pap postpartum, and she should go straight to LEEP/Cone biopsy.  If invasive carcinoma is identified on today's biopsy, I would recommend a c-rad hysterectomy at delivery.    HPI: Brittany [name] is a 29 year old G2P1 who is at [redacted] weeks GA with her second child who is seen in consultation at the request of Dr Ambrose Mantle for CIN3. The patient's first abnormal Pap smear was in November 2015. She states that this showed high-grade dysplasia and she was planned to undergo an excisional procedure late last year. 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 She then spontaneously conceived a pregnancy. Her Pap smear in early pregnancy performed on 03/20/2015 was positive for high-grade squamous epithelial lesion. She then underwent colposcopic evaluation at [redacted] weeks gestational age.  The colposcopy found mosaicism in all 4 quadrants. Biopsies were taken from 4:00 7:00 11:00 and 1:00 and revealed CIN-3.   She reports a normal pap smear 6 years ago during her last pregnancy. She has not had pap or HPV testing in the interim (until November 2015).  Interval History: She denies vaginal bleeding or postcoital bleeding. She denies abnormal discharge.  Current Meds:  Outpatient Encounter Prescriptions as of 06/05/2015  Medication Sig  . CALCIUM PO Take 1 tablet by mouth daily.   . Prenatal Vit-Fe Fumarate-FA (PRENATAL VITAMIN PO) Take 1 tablet by mouth daily.  . [DISCONTINUED] potassium chloride SA (K-DUR,KLOR-CON) 20 MEQ tablet Take 1 tablet (20 mEq total) by mouth 2 (two) times daily.   No facility-administered encounter medications on file as of 06/05/2015.    Allergy: No Known Allergies  Social Hx:   Social History   Social History  . Marital Status: Single    Spouse Name: N/A  . Number of Children: N/A  . Years of Education: N/A   Occupational History  . Not on file.   Social History Main Topics  . Smoking status: Never Smoker   . Smokeless tobacco: Not on file  . Alcohol Use: No     Comment: currently pregnant  . Drug Use: No  . Sexual Activity: Not on file   Other Topics Concern  . Not on file   Social History Narrative    Past Surgical Hx: History reviewed. No pertinent past surgical history.  Past Medical Hx: History reviewed. No pertinent past medical history.  Past Gynecological History: CIN3 No LMP recorded.  Family Hx: History reviewed. No pertinent family history.  Review of Systems:  Constitutional  Feels well,    ENT Normal appearing ears and nares bilaterally Skin/Breast  No rash, sores, jaundice, itching, dryness Cardiovascular  No chest pain, shortness of breath, or edema  Pulmonary  No cough or wheeze.  Gastro Intestinal  No nausea, vomitting, or diarrhoea. No bright red blood per rectum, no abdominal pain, change in bowel movement, or constipation.  Genito Urinary  No frequency, urgency, dysuria, see HPI Musculo Skeletal  No myalgia, arthralgia, joint swelling or pain  Neurologic  No weakness, numbness, change in gait,  Psychology  No depression, anxiety, insomnia.   Vitals:  Blood pressure 115/62, pulse 75, temperature 98.2 F (36.8 C), temperature source Oral, resp. rate 18, height 5' 5.5" (1.664 m), weight 130 lb 12.8 oz (59.33 kg), SpO2 100 %.  Physical Exam: WD in NAD Neck  Supple NROM, without  any enlargements.  Lymph Node Survey No cervical supraclavicular or inguinal adenopathy Alert and oriented to person, place, and time  Abdomen  Normoactive bowel sounds, abdomen soft, gravid to umbilicus, non-tender and thin without evidence of hernia.  Back No CVA tenderness Genito Urinary  Vulva/vagina: Normal external female genitalia.  No lesions. No discharge or bleeding.  Bladder/urethra:  No lesions or masses, well supported bladder  Vagina: normal  Cervix: Grossly normal appearing, see colpo note   Uterus: 20 weeks gravid   Adnexa: no appreciable masses. Rectal  Good tone, no masses no cul de sac nodularity.  Extremities  No bilateral cyanosis, clubbing or edema.  PROCEDURE NOTE: colpscopy of vagina and cervix with biopsies The colposcope was used to visualize the entire cervix and vagina. The entirity of the transformation zone was visualized. 4% acetic acid was used on the cervix. Areas consistent with CIN3 (irregular vasculature and erythema) were visualized particularly at the left upper cervix and midline anteriorally and posteriorally. A biopsy was taken from 12 o'clock. Hemostasis was achieved with monsels solution.  Brittany Axe, MD  06/05/2015, 4:10 PM

## 2015-06-06 ENCOUNTER — Telehealth: Payer: Self-pay | Admitting: Gynecologic Oncology

## 2015-06-06 NOTE — Telephone Encounter (Signed)
CIN3 on biopsies, recommend continuing expectant management in pregnancy and cone or leep after delivery

## 2015-06-10 LAB — OB RESULTS CONSOLE GC/CHLAMYDIA
CHLAMYDIA, DNA PROBE: NEGATIVE
GC PROBE AMP, GENITAL: NEGATIVE

## 2015-09-07 NOTE — L&D Delivery Note (Signed)
Delivery Note Pt progressed to C/C/+2 - several pushes, nurse called at 8:30, arrived on unit at 8:37pm, infant had delivered.  At 8:36 PM a viable and healthy female was delivered via Vaginal, Spontaneous Delivery (Presentation: ; Occiput Anterior).  APGAR: 9/9 ; weight P  .   Placenta status: Intact, Expressed.  Cord: 3 vessels with the following complications: None.    Anesthesia: Epidural  Episiotomy:  none Lacerations:  none Suture Repair: N/A Est. Blood Loss (mL):  75cc  Mom to postpartum.  Baby to Couplet care / Skin to Skin.  [name], Brittany [name] 10/03/2015, 8:56 PM  D/W pt circumcision for female infant inc r/b/a, wish to proceed  Br/RNI/Contra Mirena/Tdap in PNC/ B+

## 2015-09-18 LAB — OB RESULTS CONSOLE GBS: STREP GROUP B AG: NEGATIVE

## 2015-09-30 ENCOUNTER — Telehealth (HOSPITAL_COMMUNITY): Payer: Self-pay | Admitting: *Deleted

## 2015-09-30 ENCOUNTER — Encounter (HOSPITAL_COMMUNITY): Payer: Self-pay | Admitting: *Deleted

## 2015-09-30 NOTE — Telephone Encounter (Signed)
Preadmission screen  

## 2015-10-02 NOTE — H&P (Signed)
Brittany [name] is a 30 y.o. femaleG2P1001 at 37+ with cholestasis of pregnancy - difficult to tolerate itching, bile acids - 14.5.  D/W pt r/b/a of IOL and process.  Pt followed with NST and treated with ursodiol since diagnosis.  Pregnancy complicated by bx proven CIS for CKC after delivery.  Have consulted with Dr Denman George.  +FM, no LOF, no VB, occ ctx.  Pt diagnosed with Chl in pregnancy, TOC neg.      Maternal Medical History:  Contractions: Frequency: irregular.    Fetal activity: Perceived fetal activity is normal.    Prenatal Complications - Diabetes: none.    OB History    Gravida Para Term Preterm AB TAB SAB Ectopic Multiple Living   '2 1 1       1    ' G1 37wk 6#15, SVD G2 present, cholestasis  +ABN pap - CIS on bx after colpo H/o Chl in preg, TOC neg  Past Medical History  Diagnosis Date  . Carcinoma in situ of uterine cervix     seen by Dr Denman George of gyn onc.  rec. conization after delivery  . Vaginal Pap smear, abnormal   . Furunculosis of skin and subcutaneous tissue   . Hx of chlamydia infection   . Hx of varicella   elevated liver enzymes, darkened urine - 1/16 - improved, now cholestasis  Past Surgical History  Procedure Laterality Date  . No past surgeries     Family History: family history includes Alcohol abuse in her maternal grandmother. There is no history of Arthritis, Asthma, Birth defects, Cancer, COPD, Depression, Diabetes, Drug abuse, Early death, Hearing loss, Heart disease, Hyperlipidemia, Hypertension, Kidney disease, Learning disabilities, Mental illness, Mental retardation, Miscarriages / Stillbirths, Stroke, Vision loss, or Varicose Veins. Social History:  reports that she has never smoked. She has never used smokeless tobacco. She reports that she does not drink alcohol or use illicit drugs. single Meds PNV, ursodiol All NKDA   Prenatal Transfer Tool  Maternal Diabetes: No Genetic Screening: Normal Maternal Ultrasounds/Referrals: Normal Fetal  Ultrasounds or other Referrals:  None Maternal Substance Abuse:  No Significant Maternal Medications:  None Significant Maternal Lab Results:  Lab values include: Group B Strep negative Other Comments:  Chl in preg, TOC neg; RNI, cholestasis  Review of Systems  Constitutional: Negative.   HENT: Negative.   Eyes: Negative.   Respiratory: Negative.   Cardiovascular: Negative.   Gastrointestinal: Negative.   Genitourinary: Negative.   Musculoskeletal: Negative.   Skin: Positive for itching.  Neurological: Negative.   Psychiatric/Behavioral: Negative.       Last menstrual period 01/15/2015. Maternal Exam:  Abdomen: Patient reports no abdominal tenderness. Fundal height is appropriate for gestation.   Estimated fetal weight is 7#.   Fetal presentation: vertex  Introitus: Normal vulva. Normal vagina.  Cervix: Cervix evaluated by digital exam.     Physical Exam  Constitutional: She is oriented to person, place, and time. She appears well-developed and well-nourished.  HENT:  Head: Normocephalic and atraumatic.  Cardiovascular: Normal rate and regular rhythm.   Respiratory: Effort normal and breath sounds normal.  GI: Soft. Bowel sounds are normal.  Musculoskeletal: Normal range of motion.  Neurological: She is alert and oriented to person, place, and time.  Skin: Skin is warm and dry.  Psychiatric: She has a normal mood and affect. Her behavior is normal.    Prenatal labs: ABO, Rh: B/Positive/-- (07/14 0000) Antibody: Negative (07/14 0000) Rubella: Nonimmune (07/14 0000) RPR: Nonreactive (07/14 0000)  HBsAg:  Negative (07/14 0000)  HIV: Non-reactive (07/14 0000)  GBS:   negative  Tdap 07/25/15  Hgb 12.1/Plt 193/Ur Cx neg/ Chl + TOC neg/ VI/glucola 119/ AFP WNL  [place] cwd EDC 10/22/15 nl anat, ant plac, reveal party for gender    Assessment/Plan: 30yo G2P1001 at 37+ with cholestasis AROM and Pitocin for augmentation Expect SVD RNI - MMR PP  [name],  Brittany [name] 10/02/2015, 10:43 PM

## 2015-10-03 ENCOUNTER — Inpatient Hospital Stay (HOSPITAL_COMMUNITY): Payer: Managed Care, Other (non HMO) | Admitting: Anesthesiology

## 2015-10-03 ENCOUNTER — Encounter (HOSPITAL_COMMUNITY): Payer: Self-pay

## 2015-10-03 ENCOUNTER — Inpatient Hospital Stay (HOSPITAL_COMMUNITY)
Admission: AD | Admit: 2015-10-03 | Discharge: 2015-10-05 | DRG: 445 | Disposition: A | Discharge status: Home / Self Care | Payer: Managed Care, Other (non HMO) | Source: Ambulatory Visit | Attending: Obstetrics and Gynecology | Admitting: Obstetrics and Gynecology

## 2015-10-03 DIAGNOSIS — IMO0001 Reserved for inherently not codable concepts without codable children: Secondary | ICD-10-CM

## 2015-10-03 DIAGNOSIS — Z3A37 37 weeks gestation of pregnancy: Secondary | ICD-10-CM

## 2015-10-03 DIAGNOSIS — K831 Obstruction of bile duct: Principal | ICD-10-CM | POA: Diagnosis present

## 2015-10-03 DIAGNOSIS — O2662 Liver and biliary tract disorders in childbirth: Secondary | ICD-10-CM | POA: Diagnosis present

## 2015-10-03 DIAGNOSIS — O26613 Liver and biliary tract disorders in pregnancy, third trimester: Secondary | ICD-10-CM

## 2015-10-03 LAB — CBC
HEMATOCRIT: 37.6 % (ref 36.0–46.0)
HEMOGLOBIN: 12.7 g/dL (ref 12.0–15.0)
MCH: 29.2 pg (ref 26.0–34.0)
MCHC: 33.8 g/dL (ref 30.0–36.0)
MCV: 86.4 fL (ref 78.0–100.0)
PLATELETS: 158 10*3/uL (ref 150–400)
RBC: 4.35 MIL/uL (ref 3.87–5.11)
RDW: 14.3 % (ref 11.5–15.5)
WBC: 16 10*3/uL — AB (ref 4.0–10.5)

## 2015-10-03 LAB — TYPE AND SCREEN
ABO/RH(D): B POS
Antibody Screen: NEGATIVE

## 2015-10-03 MED ORDER — PRENATAL MULTIVITAMIN CH
1.0000 | ORAL_TABLET | Freq: Every day | ORAL | Status: DC
  Administered 2015-10-04: 1 via ORAL
  Filled 2015-10-03: qty 1

## 2015-10-03 MED ORDER — ZOLPIDEM TARTRATE 5 MG PO TABS: 5.0000 mg | ORAL_TABLET | Freq: Every evening | ORAL | Status: DC | PRN

## 2015-10-03 MED ORDER — LACTATED RINGERS IV SOLN
2.5000 [IU]/h | INTRAVENOUS | Status: DC
Start: 2015-10-03 — End: 2015-10-03
  Administered 2015-10-03: 21:00:00 via INTRAVENOUS
  Filled 2015-10-03: qty 4

## 2015-10-03 MED ORDER — LACTATED RINGERS IV SOLN
500.0000 mL | INTRAVENOUS | Status: DC | PRN
Start: 2015-10-03 — End: 2015-10-03

## 2015-10-03 MED ORDER — ONDANSETRON HCL 4 MG/2ML IJ SOLN: 4.0000 mg | Freq: Four times a day (QID) | INTRAMUSCULAR | Status: DC | PRN

## 2015-10-03 MED ORDER — IBUPROFEN 600 MG PO TABS
600.0000 mg | ORAL_TABLET | Freq: Four times a day (QID) | ORAL | Status: DC
  Administered 2015-10-04 (×4): 600 mg via ORAL
  Filled 2015-10-03 (×5): qty 1

## 2015-10-03 MED ORDER — BUTORPHANOL TARTRATE 1 MG/ML IJ SOLN
1.0000 mg | INTRAMUSCULAR | Status: DC | PRN
  Administered 2015-10-03: 1 mg via INTRAVENOUS
  Filled 2015-10-03: qty 1

## 2015-10-03 MED ORDER — SENNOSIDES-DOCUSATE SODIUM 8.6-50 MG PO TABS: 2.0000 | ORAL_TABLET | ORAL | Status: DC

## 2015-10-03 MED ORDER — WITCH HAZEL-GLYCERIN EX PADS: 1.0000 "application " | MEDICATED_PAD | CUTANEOUS | Status: DC | PRN

## 2015-10-03 MED ORDER — LANOLIN HYDROUS EX OINT: TOPICAL_OINTMENT | CUTANEOUS | Status: DC | PRN

## 2015-10-03 MED ORDER — ONDANSETRON HCL 4 MG PO TABS: 4.0000 mg | ORAL_TABLET | ORAL | Status: DC | PRN

## 2015-10-03 MED ORDER — ACETAMINOPHEN 325 MG PO TABS: 650.0000 mg | ORAL_TABLET | ORAL | Status: DC | PRN

## 2015-10-03 MED ORDER — LACTATED RINGERS IV SOLN
INTRAVENOUS | Status: DC
  Administered 2015-10-03: 17:00:00 via INTRAVENOUS

## 2015-10-03 MED ORDER — FLEET ENEMA 7-19 GM/118ML RE ENEM: 1.0000 | ENEMA | RECTAL | Status: DC | PRN

## 2015-10-03 MED ORDER — FENTANYL 2.5 MCG/ML BUPIVACAINE 1/10 % EPIDURAL INFUSION (WH - ANES)
14.0000 mL/h | INTRAMUSCULAR | Status: DC | PRN
  Administered 2015-10-03 (×2): 14 mL/h via EPIDURAL
  Filled 2015-10-03: qty 125

## 2015-10-03 MED ORDER — LIDOCAINE HCL (PF) 1 % IJ SOLN
INTRAMUSCULAR | Status: DC | PRN
  Administered 2015-10-03 (×2): 8 mL via EPIDURAL

## 2015-10-03 MED ORDER — LACTATED RINGERS IV SOLN: INTRAVENOUS | Status: DC

## 2015-10-03 MED ORDER — OXYTOCIN BOLUS FROM INFUSION
500.0000 mL | INTRAVENOUS | Status: DC
  Administered 2015-10-03: 500 mL via INTRAVENOUS

## 2015-10-03 MED ORDER — LACTATED RINGERS IV SOLN
500.0000 mL | INTRAVENOUS | Status: DC | PRN
  Administered 2015-10-03: 500 mL via INTRAVENOUS

## 2015-10-03 MED ORDER — SIMETHICONE 80 MG PO CHEW
80.0000 mg | CHEWABLE_TABLET | ORAL | Status: DC | PRN
Start: 2015-10-03 — End: 2015-10-05

## 2015-10-03 MED ORDER — OXYCODONE-ACETAMINOPHEN 5-325 MG PO TABS: 1.0000 | ORAL_TABLET | ORAL | Status: DC | PRN

## 2015-10-03 MED ORDER — CITRIC ACID-SODIUM CITRATE 334-500 MG/5ML PO SOLN: 30.0000 mL | ORAL | Status: DC | PRN

## 2015-10-03 MED ORDER — PHENYLEPHRINE 40 MCG/ML (10ML) SYRINGE FOR IV PUSH (FOR BLOOD PRESSURE SUPPORT)
80.0000 ug | PREFILLED_SYRINGE | INTRAVENOUS | Status: DC | PRN
  Filled 2015-10-03: qty 20
  Filled 2015-10-03: qty 2

## 2015-10-03 MED ORDER — TERBUTALINE SULFATE 1 MG/ML IJ SOLN
0.2500 mg | Freq: Once | INTRAMUSCULAR | Status: DC | PRN
  Filled 2015-10-03: qty 1

## 2015-10-03 MED ORDER — LIDOCAINE HCL (PF) 1 % IJ SOLN
30.0000 mL | INTRAMUSCULAR | Status: DC | PRN
  Filled 2015-10-03: qty 30

## 2015-10-03 MED ORDER — DIPHENHYDRAMINE HCL 50 MG/ML IJ SOLN: 12.5000 mg | INTRAMUSCULAR | Status: DC | PRN

## 2015-10-03 MED ORDER — BENZOCAINE-MENTHOL 20-0.5 % EX AERO: 1.0000 "application " | INHALATION_SPRAY | CUTANEOUS | Status: DC | PRN

## 2015-10-03 MED ORDER — OXYCODONE-ACETAMINOPHEN 5-325 MG PO TABS: 2.0000 | ORAL_TABLET | ORAL | Status: DC | PRN

## 2015-10-03 MED ORDER — DIPHENHYDRAMINE HCL 25 MG PO CAPS: 25.0000 mg | ORAL_CAPSULE | Freq: Four times a day (QID) | ORAL | Status: DC | PRN

## 2015-10-03 MED ORDER — DIBUCAINE 1 % RE OINT: 1.0000 "application " | TOPICAL_OINTMENT | RECTAL | Status: DC | PRN

## 2015-10-03 MED ORDER — ONDANSETRON HCL 4 MG/2ML IJ SOLN: 4.0000 mg | INTRAMUSCULAR | Status: DC | PRN

## 2015-10-03 MED ORDER — EPHEDRINE 5 MG/ML INJ
10.0000 mg | INTRAVENOUS | Status: DC | PRN
  Filled 2015-10-03: qty 2

## 2015-10-03 NOTE — Progress Notes (Signed)
1620 pt examined to determine need for pitocin, pt calm and breathing on right side. When turned to back and uncovered for exam, significant bloody show noted under patient and on towel. sVE 6/90/0. Upon removing hand, orange sized clot fell out. Pad and chux weighed and measured with clot=193cc blood. Dr Ellyn Hack called and advised.

## 2015-10-03 NOTE — MAU Note (Addendum)
Has been contracting, now every 5 min.  Having some bleeding. ? Leaking.  Was scheduled for induction tomorrow: ?cholestasis of preg

## 2015-10-03 NOTE — Progress Notes (Signed)
Patient ID: Brittany [name], female   DOB: 1986/06/02, 30 y.o.   MRN: [id number]  Comfortable with ctx   AFVSS FHTs - 140-150, good var, category 1 toco - q 2-3 min  SVE 8/80/0  Continue current mgmt Expect SVD

## 2015-10-03 NOTE — Anesthesia Preprocedure Evaluation (Signed)

## 2015-10-03 NOTE — MAU Note (Signed)
Report given to Dr. Ellyn Hack 3/80/-2 large amount of bloody show, contractions every 3 to 5 minutes, fhr reassuring.  Dr. Ellyn Hack to place admit orders.

## 2015-10-03 NOTE — Anesthesia Procedure Notes (Signed)
Epidural Patient location during procedure: OB Start time: 10/03/2015 5:10 PM End time: 10/03/2015 5:14 PM  Staffing Anesthesiologist: Leilani Able Performed by: anesthesiologist   Preanesthetic Checklist Completed: patient identified, surgical consent, pre-op evaluation, timeout performed, IV checked, risks and benefits discussed and monitors and equipment checked  Epidural Patient position: sitting Prep: site prepped and draped and DuraPrep Patient monitoring: continuous pulse ox and blood pressure Approach: midline Location: L3-L4 Injection technique: LOR air  Needle:  Needle type: Tuohy  Needle gauge: 17 G Needle length: 9 cm and 9 Needle insertion depth: 6 cm Catheter type: closed end flexible Catheter size: 19 Gauge Catheter at skin depth: 11 cm Test dose: negative and Other  Assessment Sensory level: T9 Events: blood not aspirated, injection not painful, no injection resistance, negative IV test and no paresthesia  Additional Notes Reason for block:procedure for pain

## 2015-10-04 ENCOUNTER — Inpatient Hospital Stay (HOSPITAL_COMMUNITY)
Admission: RE | Admit: 2015-10-04 | Discharge: 2015-10-04 | Disposition: A | Discharge status: Home / Self Care | Payer: Managed Care, Other (non HMO) | Source: Ambulatory Visit | Attending: Obstetrics and Gynecology | Admitting: Obstetrics and Gynecology

## 2015-10-04 LAB — CBC
HCT: 31.9 % — ABNORMAL LOW (ref 36.0–46.0)
HEMOGLOBIN: 10.7 g/dL — AB (ref 12.0–15.0)
MCH: 29.2 pg (ref 26.0–34.0)
MCHC: 33.5 g/dL (ref 30.0–36.0)
MCV: 86.9 fL (ref 78.0–100.0)
PLATELETS: 154 10*3/uL (ref 150–400)
RBC: 3.67 MIL/uL — AB (ref 3.87–5.11)
RDW: 13.9 % (ref 11.5–15.5)
WBC: 17.9 10*3/uL — AB (ref 4.0–10.5)

## 2015-10-04 LAB — RPR: RPR: NONREACTIVE

## 2015-10-04 LAB — ABO/RH: ABO/RH(D): B POS

## 2015-10-04 NOTE — Progress Notes (Signed)
Post Partum Day 1 Subjective: no complaints, up ad lib, voiding, tolerating PO and nl lochia, pain controlled  Objective: Blood pressure 108/62, pulse 65, temperature 98.1 F (36.7 C), temperature source Oral, resp. rate 18, height  (1.626 m), weight 66.316 kg (146 lb 3.2 oz), last menstrual period 01/15/2015, SpO2 99 %, unknown if currently breastfeeding.  Physical Exam:  General: alert and no distress Lochia: appropriate Uterine Fundus: firm   Recent Labs  10/03/15 1400 10/04/15 0536  HGB 12.7 10.7*  HCT 37.6 31.9*    Assessment/Plan: Plan for discharge tomorrow, Breastfeeding and Lactation consult.  Routine care   LOS: 1 day   [name], [name] 10/04/2015, 8:25 AM

## 2015-10-04 NOTE — Lactation Note (Signed)
This note was copied from the chart of Brittany [name]. Lactation Consultation Note  Patient Name: Brittany Ardyn Forge [id number] Date: 10/04/2015 Reason for consult: Initial assessment Infant is 67 hours old & seen by [facility] for initial assessment. Baby was born at [redacted]w[redacted]d & weighed 6+0.6# at birth. This is mom's 2nd child- mom reports she BF for ~3 wks and then stopped because mom reported the first child had jaundice. Baby was at left breast in cradle hold when [facility] entered but mom stated he had just come off after BF for ~25 mins. Baby woke up & started to cry but would not re-latch. Suggested calming him down before trying to latch again. Baby has BF 6x, had 3 wet & 2 stool diapers. Provided mom with Lactation booklet, feeding log, & BF resources; reviewed outpatient services & support group. Also reviewed BF pages in Baby & Me booklet; discussed BF positions (suggested cross-cradle or foot-ball hold while he is still learning how to BF), latch tips, tips to prevent & treat sore nipples & engorgement, I/O, & milk storage guidelines. By this point, infant was showing hunger cues again so mom tried latching baby on right breast in cross-cradle hold. Mom has long everted nipples. Mom had him latched but his mouth was not very wide. Encouraged mom to [facility] & try again waiting for a wide mouth. Once baby was on, mom reported no discomfort. Encouraged mom to continue working on latching him when he opens his mouth wide- mom might need more reinforcement with this at future feeds. Encouraged mom to feed on cue at least 8-12x/ 24hrs. Mom reports she plans to get DEBP from insurance. Mom reported no questions. LC left mom & baby BF. Encouraged mom to ask for [facility] tomorrow to assess another latch.   Maternal Data Does the patient have breastfeeding experience prior to this delivery?: Yes  Feeding Feeding Type: Breast Fed Length of feed:  (was still feeding when LC left)  LATCH Score/Interventions Latch: Grasps breast  easily, tongue down, lips flanged, rhythmical sucking. Intervention(s): Adjust position  Audible Swallowing: None Intervention(s): Skin to skin  Type of Nipple: Everted at rest and after stimulation  Comfort (Breast/Nipple): Soft / non-tender     Hold (Positioning): Assistance needed to correctly position infant at breast and maintain latch. Intervention(s): Support Pillows;Position options;Breastfeeding basics reviewed  LATCH Score: 7  Lactation Tools Discussed/Used     Consult Status Consult Status: Follow-up Date: 10/05/15 Follow-up type: In-patient    Oneal Grout 10/04/2015, 4:29 PM

## 2015-10-05 MED ORDER — IBUPROFEN 800 MG PO TABS: 800.0000 mg | ORAL_TABLET | Freq: Three times a day (TID) | ORAL | Status: DC | PRN

## 2015-10-05 MED ORDER — PRENATAL VITAMIN 27-0.8 MG PO TABS: 1.0000 | ORAL_TABLET | Freq: Every day | ORAL | Status: DC

## 2015-10-05 MED ORDER — MEASLES, MUMPS & RUBELLA VAC ~~LOC~~ INJ
0.5000 mL | INJECTION | Freq: Once | SUBCUTANEOUS | Status: AC
  Administered 2015-10-05: 0.5 mL via SUBCUTANEOUS
  Filled 2015-10-05 (×2): qty 0.5

## 2015-10-05 NOTE — Discharge Summary (Signed)
OB Discharge Summary     Patient Name: Brittany [name] DOB: August 19, 1986 MRN: [id number]  Date of admission: 10/03/2015 Delivering MD: Sherian Rein   Date of discharge: 10/05/2015  Admitting diagnosis: 37WKS,LABOR Intrauterine pregnancy: [redacted]w[redacted]d     Secondary diagnosis:  Principal Problem:   SVD (spontaneous vaginal delivery) Active Problems:   Active labor at term   Cholestasis of pregnancy in third trimester  Additional problems: N/A     Discharge diagnosis: Term Pregnancy Delivered                                                                                                Post partum procedures:N/A  Augmentation: AROM and Pitocin  Complications: None  Hospital course:  Onset of Labor With Vaginal Delivery     30 y.o. yo [id number] at [redacted]w[redacted]d was admitted in Latent Labor on 10/03/2015. Patient had an uncomplicated labor course as follows:  Membrane Rupture Time/Date: 4:00 PM ,10/02/2015   Intrapartum Procedures: Episiotomy: None [1]                                         Lacerations:  None [1]  Patient had a delivery of a Viable infant. 10/03/2015  Information for the patient's newborn:  Tami, Blass [[id number]]  Delivery Method: Vag-Spont    Pateint had an uncomplicated postpartum course.  She is ambulating, tolerating a regular diet, passing flatus, and urinating well. Patient is discharged home in stable condition on 10/05/2015.    Physical exam  Filed Vitals:   10/04/15 0400 10/04/15 1210 10/04/15 1729 10/05/15 0602  BP: 108/62 98/54 118/67 105/56  Pulse: 65 78 72 65  Temp: 98.1 F (36.7 C) 98.2 F (36.8 C) 98.2 F (36.8 C) 99 F (37.2 C)  TempSrc: Oral Oral Oral Oral  Resp: Height:      Weight:      SpO2: 99% 100% 100% 99%   General: alert and no distress Lochia: appropriate Uterine Fundus: firm  Labs: Lab Results  Component Value Date   WBC 17.9* 10/04/2015   HGB 10.7* 10/04/2015   HCT 31.9* 10/04/2015   MCV 86.9 10/04/2015    PLT 154 10/04/2015   CMP Latest Ref Rng 10/04/2014  Glucose 70 - 99 mg/dL 130(Q)  BUN 6 - 23 mg/dL 6  Creatinine 6.57 - 8.46 mg/dL 9.62  Sodium 952 - 841 mmol/L 137  Potassium 3.5 - 5.1 mmol/L 4.2  Chloride 96 - 112 mmol/L 102  CO2 19 - 32 mmol/L 31  Calcium 8.4 - 10.5 mg/dL 9.5  Total Protein 6.0 - 8.3 g/dL 7.4  Total Bilirubin 0.3 - 1.2 mg/dL 4.2(H)  Alkaline Phos 39 - 117 U/L 161(H)  AST 0 - 37 U/L 152(H)  ALT 0 - 35 U/L 251(H)    Discharge instruction: per After Visit Summary and "Baby and Me Booklet".  After visit meds:    Medication List    TAKE these medications  CALCIUM PO  Take 1 tablet by mouth daily.     ibuprofen 800 MG tablet  Commonly known as:  ADVIL,MOTRIN  Take 1 tablet (800 mg total) by mouth every 8 (eight) hours as needed.     Prenatal Vitamin 27-0.8 MG Tabs  Take 1 tablet by mouth daily.        Diet: routine diet  Activity: Advance as tolerated. Pelvic rest for 6 weeks.   Outpatient follow up:6 weeks Follow up Appt:No future appointments. Follow up Visit:No Follow-up on file.  Postpartum contraception: IUD Mirena  Newborn Data: Live born female  Birth Weight: 6 lb 0.6 oz (2739 g) APGAR: 9, 9  Baby Feeding: Breast Disposition:home with mother   10/05/2015 Sherian Rein, MD

## 2015-10-05 NOTE — Progress Notes (Addendum)
Post Partum Day 2 Subjective: no complaints, up ad lib, voiding, tolerating PO and nl lochia, pain controlled  Objective: Blood pressure 105/56, pulse 65, temperature 99 F (37.2 C), temperature source Oral, resp. rate 16, height  (1.626 m), weight 66.316 kg (146 lb 3.2 oz), last menstrual period 01/15/2015, SpO2 99 %, unknown if currently breastfeeding.  Physical Exam:  General: alert and no distress Lochia: appropriate Uterine Fundus: firm   Recent Labs  10/03/15 1400 10/04/15 0536  HGB 12.7 10.7*  HCT 37.6 31.9*    Assessment/Plan: Discharge home, Breastfeeding and Lactation consult.  Routine care - d/c with motrin and PNV.  F/u 6 wks   LOS: 2 days   [name], Avonne Berkery 10/05/2015, 7:48 AM

## 2015-10-05 NOTE — Lactation Note (Signed)
This note was copied from the chart of Brittany [name]. Lactation Consultation Note  Mother latched baby in cross cradle position. Assisted w/ position and pillows. Baby latches easily.  Sucks and some swallows observed. Encouraged parents to feed baby 8-12 times every 3 hours and with feeding cues.   Provided mother w/ hand pump. Reviewed engorgement care and monitoring voids/stools.    Patient Name: Brittany Damica Gravlin [id number] Date: 10/05/2015 Reason for consult: Follow-up assessment   Maternal Data    Feeding Feeding Type: Breast Fed Length of feed: 10 min  LATCH Score/Interventions Latch: Grasps breast easily, tongue down, lips flanged, rhythmical sucking.  Audible Swallowing: A few with stimulation  Type of Nipple: Everted at rest and after stimulation  Comfort (Breast/Nipple): Soft / non-tender     Hold (Positioning): No assistance needed to correctly position infant at breast.  LATCH Score: 9  Lactation Tools Discussed/Used     Consult Status Consult Status: Complete    Hardie Pulley 10/05/2015, 9:47 AM

## 2015-11-27 ENCOUNTER — Encounter (HOSPITAL_COMMUNITY): Payer: Self-pay | Admitting: *Deleted

## 2015-12-01 ENCOUNTER — Encounter (HOSPITAL_COMMUNITY): Payer: Self-pay | Admitting: Anesthesiology

## 2015-12-01 NOTE — H&P (Signed)
Brittany [name] is an 30 y.o. 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  Consulted with Dr Andrey Farmer, had a colpo with GynOnc, agree with dx - for CKC after delivery.  Pt had SVD 10/03/15.  Reviewed procedure with her and indication, wishes to proceed.  Pt had h/o HGSIL and had no f/u for several years prior to pregnancy.  For IUD placement with CKC  Pertinent Gynecological History: Menses: postpartum, was normal Bleeding: recent lochia Contraception: to have IUD placed DES exposure: denies Blood transfusions: none Sexually transmitted diseases: past history: Chl with pregnancy Previous GYN Procedures: N/A  Last mammogram: N/A  Last pap: abnormal: HGSIL OB History: G2, P2   Menstrual History:  Patient's last menstrual period was 11/12/2015 (approximate).    Past Medical History  Diagnosis Date  . Carcinoma in situ of uterine cervix     seen by Dr Andrey Farmer of gyn onc.  rec. conization after delivery  . Vaginal Pap smear, abnormal   . Furunculosis of skin and subcutaneous tissue   . Hx of chlamydia infection   . Hx of varicella   . Cholestasis of pregnancy in third trimester   . SVD (spontaneous vaginal delivery) 10/03/2015    Past Surgical History  Procedure Laterality Date  . No past surgeries      Family History  Problem Relation Age of Onset  . Alcohol abuse Maternal Grandmother   . Arthritis Neg Hx   . Asthma Neg Hx   . Birth defects Neg Hx   . Cancer Neg Hx   . COPD Neg Hx   . Depression Neg Hx   . Diabetes Neg Hx   . Drug abuse Neg Hx   . Early death Neg Hx   . Hearing loss Neg Hx   . Heart disease Neg Hx   . Hyperlipidemia Neg Hx   . Hypertension Neg Hx   . Kidney disease Neg Hx   . Learning disabilities Neg Hx   . Mental illness Neg Hx   . Mental retardation Neg Hx   . Miscarriages / Stillbirths Neg Hx   . Stroke Neg Hx   . Vision loss Neg Hx   . Varicose Veins Neg Hx     Social History:  reports that she has never smoked. She has never used  smokeless tobacco. She reports that she does not drink alcohol or use illicit drugs.  Allergies: No Known Allergies  No prescriptions prior to admission    Review of Systems  Constitutional: Negative.   HENT: Negative.   Eyes: Negative.   Gastrointestinal: Negative.   Genitourinary: Negative.   Musculoskeletal: Negative.   Skin: Negative.   Neurological: Negative.   Psychiatric/Behavioral: Negative.     Height  (1.626 m), weight 58.968 kg (130 lb), last menstrual period 11/12/2015, currently breastfeeding. Physical Exam  Constitutional: She is oriented to person, place, and time. She appears well-developed and well-nourished.  HENT:  Head: Normocephalic and atraumatic.  Cardiovascular: Normal rate and regular rhythm.   Respiratory: Effort normal and breath sounds normal. No respiratory distress. She has no wheezes.  GI: Soft. Bowel sounds are normal. She exhibits no distension. There is no tenderness.  Musculoskeletal: Normal range of motion.  Neurological: She is alert and oriented to person, place, and time.  Skin: Skin is warm and dry.  Psychiatric: She has a normal mood and affect. Her behavior is normal.    H/O ASC H pap 2010, HGSIL 2015; colpo CIN 3/CIS.  Assessment/Plan:  29yo [id number] wih CIN 3/CIS for CKC Plan for CKC - d/w pt r/b/a  [name], Dalylah Ramey 12/01/2015, 10:43 AM

## 2015-12-01 NOTE — Anesthesia Preprocedure Evaluation (Addendum)
Anesthesia Evaluation  Patient identified by MRN, date of birth, ID band Patient awake    Reviewed: Allergy & Precautions, H&P , NPO status , Patient's Chart, lab work & pertinent test results, reviewed documented beta blocker date and time   Airway Mallampati: I  TM Distance: >3 FB Neck ROM: full    Dental no notable dental hx. (+) Teeth Intact   Pulmonary neg pulmonary ROS,    Pulmonary exam normal        Cardiovascular negative cardio ROS Normal cardiovascular exam     Neuro/Psych negative neurological ROS  negative psych ROS   GI/Hepatic negative GI ROS, Neg liver ROS,   Endo/Other  negative endocrine ROS  Renal/GU negative Renal ROS     Musculoskeletal   Abdominal Normal abdominal exam  (+)   Peds  Hematology negative hematology ROS (+)   Anesthesia Other Findings   Reproductive/Obstetrics negative OB ROS                             Anesthesia Physical Anesthesia Plan  ASA: I  Anesthesia Plan: General   Post-op Pain Management:    Induction: Intravenous  Airway Management Planned: LMA  Additional Equipment:   Intra-op Plan:   Post-operative Plan:   Informed Consent: I have reviewed the patients History and Physical, chart, labs and discussed the procedure including the risks, benefits and alternatives for the proposed anesthesia with the patient or authorized representative who has indicated his/her understanding and acceptance.     Plan Discussed with: CRNA and Surgeon  Anesthesia Plan Comments:        Anesthesia Quick Evaluation

## 2015-12-02 ENCOUNTER — Ambulatory Visit (HOSPITAL_COMMUNITY): Payer: 59 | Admitting: Anesthesiology

## 2015-12-02 ENCOUNTER — Encounter (HOSPITAL_COMMUNITY)
Admission: RE | Disposition: A | Discharge status: Home / Self Care | Payer: Self-pay | Source: Ambulatory Visit | Attending: Obstetrics and Gynecology

## 2015-12-02 ENCOUNTER — Encounter (HOSPITAL_COMMUNITY): Payer: Self-pay | Admitting: *Deleted

## 2015-12-02 ENCOUNTER — Ambulatory Visit (HOSPITAL_COMMUNITY)
Admission: RE | Admit: 2015-12-02 | Discharge: 2015-12-02 | Disposition: A | Discharge status: Home / Self Care | Payer: 59 | Source: Ambulatory Visit | Attending: Obstetrics and Gynecology | Admitting: Obstetrics and Gynecology

## 2015-12-02 DIAGNOSIS — D069 Carcinoma in situ of cervix, unspecified: Secondary | ICD-10-CM | POA: Diagnosis not present

## 2015-12-02 HISTORY — PX: CERVICAL CONIZATION W/BX: SHX1330

## 2015-12-02 LAB — CBC
HCT: 35.5 % — ABNORMAL LOW (ref 36.0–46.0)
HEMOGLOBIN: 11.9 g/dL — AB (ref 12.0–15.0)
MCH: 28.3 pg (ref 26.0–34.0)
MCHC: 33.5 g/dL (ref 30.0–36.0)
MCV: 84.3 fL (ref 78.0–100.0)
Platelets: 165 10*3/uL (ref 150–400)
RBC: 4.21 MIL/uL (ref 3.87–5.11)
RDW: 13.6 % (ref 11.5–15.5)
WBC: 4.9 10*3/uL (ref 4.0–10.5)

## 2015-12-02 LAB — PREGNANCY, URINE: PREG TEST UR: NEGATIVE

## 2015-12-02 SURGERY — CONE BIOPSY, CERVIX
Anesthesia: General

## 2015-12-02 MED ORDER — IODINE STRONG (LUGOLS) 5 % PO SOLN
ORAL | Status: DC | PRN
  Administered 2015-12-02: 0.2 mL via ORAL

## 2015-12-02 MED ORDER — GLYCOPYRROLATE 0.2 MG/ML IJ SOLN
INTRAMUSCULAR | Status: AC
  Filled 2015-12-02: qty 1

## 2015-12-02 MED ORDER — LIDOCAINE HCL 1 % IJ SOLN
INTRAMUSCULAR | Status: DC | PRN
  Administered 2015-12-02: 10 mL

## 2015-12-02 MED ORDER — ONDANSETRON HCL 4 MG/2ML IJ SOLN
INTRAMUSCULAR | Status: DC | PRN
  Administered 2015-12-02: 4 mg via INTRAVENOUS

## 2015-12-02 MED ORDER — PROPOFOL 10 MG/ML IV BOLUS
INTRAVENOUS | Status: AC
Start: 2015-12-02 — End: 2015-12-02
  Filled 2015-12-02: qty 20

## 2015-12-02 MED ORDER — KETOROLAC TROMETHAMINE 30 MG/ML IJ SOLN
INTRAMUSCULAR | Status: AC
  Filled 2015-12-02: qty 1

## 2015-12-02 MED ORDER — FERRIC SUBSULFATE 259 MG/GM EX SOLN
CUTANEOUS | Status: AC
  Filled 2015-12-02: qty 8

## 2015-12-02 MED ORDER — LIDOCAINE HCL 1 % IJ SOLN
INTRAMUSCULAR | Status: AC
  Filled 2015-12-02: qty 20

## 2015-12-02 MED ORDER — KETOROLAC TROMETHAMINE 30 MG/ML IJ SOLN
INTRAMUSCULAR | Status: DC | PRN
  Administered 2015-12-02: 30 mg via INTRAVENOUS

## 2015-12-02 MED ORDER — PROPOFOL 10 MG/ML IV BOLUS
INTRAVENOUS | Status: DC | PRN
  Administered 2015-12-02: 200 mg via INTRAVENOUS

## 2015-12-02 MED ORDER — FENTANYL CITRATE (PF) 250 MCG/5ML IJ SOLN
INTRAMUSCULAR | Status: DC | PRN
  Administered 2015-12-02: 100 ug via INTRAVENOUS

## 2015-12-02 MED ORDER — IODINE STRONG (LUGOLS) 5 % PO SOLN
ORAL | Status: AC
  Filled 2015-12-02: qty 1

## 2015-12-02 MED ORDER — LACTATED RINGERS IV SOLN
INTRAVENOUS | Status: DC
  Administered 2015-12-02: 07:00:00 via INTRAVENOUS

## 2015-12-02 MED ORDER — OXYCODONE-ACETAMINOPHEN 5-325 MG PO TABS: 1.0000 | ORAL_TABLET | Freq: Four times a day (QID) | ORAL | Status: DC | PRN

## 2015-12-02 MED ORDER — LACTATED RINGERS IV SOLN: INTRAVENOUS | Status: DC

## 2015-12-02 MED ORDER — LIDOCAINE HCL (CARDIAC) 20 MG/ML IV SOLN
INTRAVENOUS | Status: DC | PRN
  Administered 2015-12-02: 90 mg via INTRAVENOUS

## 2015-12-02 MED ORDER — IBUPROFEN 100 MG/5ML PO SUSP
200.0000 mg | Freq: Four times a day (QID) | ORAL | Status: DC | PRN
  Filled 2015-12-02: qty 20

## 2015-12-02 MED ORDER — MIDAZOLAM HCL 2 MG/2ML IJ SOLN
INTRAMUSCULAR | Status: AC
  Filled 2015-12-02: qty 2

## 2015-12-02 MED ORDER — SCOPOLAMINE 1 MG/3DAYS TD PT72
1.0000 | MEDICATED_PATCH | Freq: Once | TRANSDERMAL | Status: DC
  Administered 2015-12-02: 1.5 mg via TRANSDERMAL

## 2015-12-02 MED ORDER — SCOPOLAMINE 1 MG/3DAYS TD PT72
MEDICATED_PATCH | TRANSDERMAL | Status: AC
  Administered 2015-12-02: 1.5 mg via TRANSDERMAL
  Filled 2015-12-02: qty 1

## 2015-12-02 MED ORDER — FERRIC SUBSULFATE SOLN
Status: DC | PRN
  Administered 2015-12-02: 1

## 2015-12-02 MED ORDER — FENTANYL CITRATE (PF) 100 MCG/2ML IJ SOLN
INTRAMUSCULAR | Status: AC
  Filled 2015-12-02: qty 4

## 2015-12-02 MED ORDER — ACETIC ACID 5 % SOLN
Status: AC
  Filled 2015-12-02: qty 500

## 2015-12-02 MED ORDER — KETOROLAC TROMETHAMINE 30 MG/ML IJ SOLN: 30.0000 mg | Freq: Once | INTRAMUSCULAR | Status: DC

## 2015-12-02 MED ORDER — MEPERIDINE HCL 25 MG/ML IJ SOLN: 6.2500 mg | INTRAMUSCULAR | Status: DC | PRN

## 2015-12-02 MED ORDER — ONDANSETRON HCL 4 MG/2ML IJ SOLN
INTRAMUSCULAR | Status: AC
  Filled 2015-12-02: qty 2

## 2015-12-02 MED ORDER — LIDOCAINE HCL (CARDIAC) 20 MG/ML IV SOLN
INTRAVENOUS | Status: AC
Start: 2015-12-02 — End: 2015-12-02
  Filled 2015-12-02: qty 5

## 2015-12-02 MED ORDER — GLYCOPYRROLATE 0.2 MG/ML IJ SOLN
INTRAMUSCULAR | Status: DC | PRN
  Administered 2015-12-02: 0.1 mg via INTRAVENOUS

## 2015-12-02 MED ORDER — DEXAMETHASONE SODIUM PHOSPHATE 10 MG/ML IJ SOLN
INTRAMUSCULAR | Status: AC
  Filled 2015-12-02: qty 1

## 2015-12-02 MED ORDER — ONDANSETRON HCL 4 MG/2ML IJ SOLN: 4.0000 mg | Freq: Once | INTRAMUSCULAR | Status: DC | PRN

## 2015-12-02 MED ORDER — DEXAMETHASONE SODIUM PHOSPHATE 4 MG/ML IJ SOLN
INTRAMUSCULAR | Status: DC | PRN
  Administered 2015-12-02: 10 mg via INTRAVENOUS

## 2015-12-02 MED ORDER — FENTANYL CITRATE (PF) 100 MCG/2ML IJ SOLN: 25.0000 ug | INTRAMUSCULAR | Status: DC | PRN

## 2015-12-02 MED ORDER — IBUPROFEN 200 MG PO TABS
200.0000 mg | ORAL_TABLET | Freq: Four times a day (QID) | ORAL | Status: DC | PRN
  Filled 2015-12-02: qty 2

## 2015-12-02 MED ORDER — MIDAZOLAM HCL 2 MG/2ML IJ SOLN
INTRAMUSCULAR | Status: DC | PRN
  Administered 2015-12-02: 2 mg via INTRAVENOUS

## 2015-12-02 MED ORDER — HYDROCODONE-ACETAMINOPHEN 7.5-325 MG PO TABS: 1.0000 | ORAL_TABLET | Freq: Once | ORAL | Status: DC | PRN

## 2015-12-02 SURGICAL SUPPLY — 37 items
APPLICATOR COTTON TIP 6IN STRL (MISCELLANEOUS) ×2 IMPLANT
BLADE SURG 15 STRL LF C SS BP (BLADE) ×1 IMPLANT
BLADE SURG 15 STRL SS (BLADE) ×3
CATH ROBINSON RED A/P 16FR (CATHETERS) ×3 IMPLANT
CLOTH BEACON ORANGE TIMEOUT ST (SAFETY) ×3 IMPLANT
CONTAINER PREFILL 10% NBF 60ML (FORM) ×6 IMPLANT
COUNTER NEEDLE 1200 MAGNETIC (NEEDLE) ×2 IMPLANT
ELECT BALL LEEP 5MM RED (ELECTRODE) ×3 IMPLANT
ELECT REM PT RETURN 9FT ADLT (ELECTROSURGICAL) ×3
ELECTRODE REM PT RTRN 9FT ADLT (ELECTROSURGICAL) IMPLANT
GLOVE BIO SURGEON STRL SZ 6.5 (GLOVE) ×2 IMPLANT
GLOVE BIO SURGEONS STRL SZ 6.5 (GLOVE) ×1
GLOVE BIOGEL PI IND STRL 7.0 (GLOVE) ×2 IMPLANT
GLOVE BIOGEL PI INDICATOR 7.0 (GLOVE) ×4
GOWN STRL REUS W/TWL LRG LVL3 (GOWN DISPOSABLE) ×6 IMPLANT
HEMOSTAT SURGICEL 2X14 (HEMOSTASIS) IMPLANT
HEMOSTAT SURGICEL 2X3 (HEMOSTASIS) IMPLANT
NDL SPNL 22GX3.5 QUINCKE BK (NEEDLE) ×1 IMPLANT
NEEDLE SPNL 22GX3.5 QUINCKE BK (NEEDLE) ×3 IMPLANT
NS IRRIG 1000ML POUR BTL (IV SOLUTION) ×1 IMPLANT
PACK VAGINAL MINOR WOMEN LF (CUSTOM PROCEDURE TRAY) ×3 IMPLANT
PAD OB MATERNITY 4.3X12.25 (PERSONAL CARE ITEMS) ×3 IMPLANT
PAD PREP 24X48 CUFFED NSTRL (MISCELLANEOUS) ×3 IMPLANT
PENCIL BUTTON HOLSTER BLD 10FT (ELECTRODE) ×2 IMPLANT
SCOPETTES 8  STERILE (MISCELLANEOUS) ×6
SCOPETTES 8 STERILE (MISCELLANEOUS) ×2 IMPLANT
SPONGE SURGIFOAM ABS GEL 12-7 (HEMOSTASIS) IMPLANT
SUT SILK 0 FSL (SUTURE) ×3 IMPLANT
SUT VIC AB 2-0 SH 27 (SUTURE) ×3
SUT VIC AB 2-0 SH 27XBRD (SUTURE) IMPLANT
SYR 20CC LL (SYRINGE) ×3 IMPLANT
SYR TB 1ML 25GX5/8 (SYRINGE) ×3 IMPLANT
TOWEL OR 17X24 6PK STRL BLUE (TOWEL DISPOSABLE) ×6 IMPLANT
TUBING NON-CON 1/4 X 20 CONN (TUBING) ×2 IMPLANT
TUBING NON-CON 1/4 X 20' CONN (TUBING) ×1
WATER STERILE IRR 1000ML POUR (IV SOLUTION) ×3 IMPLANT
YANKAUER SUCT BULB TIP NO VENT (SUCTIONS) ×3 IMPLANT

## 2015-12-02 NOTE — Anesthesia Procedure Notes (Signed)
Procedure Name: LMA Insertion Date/Time: 12/02/2015 7:32 AM Performed by: Graciela HusbandsFUSSELL, Yesica Kemler O Pre-anesthesia Checklist: Patient identified, Patient being monitored, Timeout performed, Emergency Drugs available and Suction available Patient Re-evaluated:Patient Re-evaluated prior to inductionOxygen Delivery Method: Circle system utilized Preoxygenation: Pre-oxygenation with 100% oxygen Intubation Type: IV induction LMA: LMA inserted LMA Size: 4.0 Number of attempts: 1 Placement Confirmation: positive ETCO2 and breath sounds checked- equal and bilateral Tube secured with: Tape Dental Injury: Teeth and Oropharynx as per pre-operative assessment

## 2015-12-02 NOTE — Brief Op Note (Signed)
12/02/2015  8:17 AM  PATIENT:  Stephania FragminKasey L Reese  30 y.o. female  PRE-OPERATIVE DIAGNOSIS:  Carinoma in situ of cervix  POST-OPERATIVE DIAGNOSIS:  Carinoma in situ of cervix  PROCEDURE:  Procedure(s): CONIZATION CERVIX WITH BIOPSY (N/A)  SURGEON:  Surgeon(s) and Role:    * Braeden Kennan [name], MD - Primary  ANESTHESIA:   general by LMA  EBL:  Total I/O In: -  Out: 50 [Blood:50] uop 100  BLOOD ADMINISTERED:none  DRAINS: none   LOCAL MEDICATIONS USED:  LIDOCAINE  and Amount: 10 ml  SPECIMEN:  Source of Specimen:  ectocervical bx, ECC  DISPOSITION OF SPECIMEN:  PATHOLOGY  COUNTS:  YES  TOURNIQUET:  * No tourniquets in log *  DICTATION: .Other Dictation: Dictation Number [id number]  PLAN OF CARE: Discharge to home after PACU  PATIENT DISPOSITION:  PACU - hemodynamically stable.   Delay start of Pharmacological VTE agent (>24hrs) due to surgical blood loss or risk of bleeding: not applicable

## 2015-12-02 NOTE — Op Note (Signed)
NAMWyatt [name]:  REESE, Min                 ACCOUNT NO.:  [id number]  MEDICAL RECORD NO.:  [id number]  LOCATION:  [facility]                          FACILITY:  [facility]  PHYSICIAN:  Sherron MondayJody Bovard, MD        DATE OF BIRTH:  1986/05/27  DATE OF PROCEDURE:  12/02/2015 DATE OF DISCHARGE:  12/02/2015                              OPERATIVE REPORT   PREOPERATIVE DIAGNOSIS:  Carcinoma in situ of the cervix.  POSTOPERATIVE DIAGNOSIS:  Carcinoma in situ of the cervix.  PROCEDURE:  Cold knife cone with endocervical curettage.  SURGEON:  Sherron MondayJody Bovard, MD  ANESTHESIA:  General via LMA.  IV FLUIDS:  Unknown.  URINE OUTPUT:  Approximately 100 mL by I and O cath.  EBL:  50 mL.  COMPLICATIONS:  None.  PATHOLOGY:  Ectocervical biopsy and endocervical curettage.  PROCEDURE IN DETAIL:  After informed consent was reviewed with the patient, she was transported to the operating room, placed on the table in supine position and placed in the Yell[place]in stirrups.  General anesthesia was induced and found to be adequate.  She was then prepped and draped in the normal sterile fashion.  Her bladder was sterilely drained.  Using an open-sided speculum, her cervix was easily visualized and a paracervical block was placed with 1% lidocaine at 12, 4, and 8. Then Lugol's solution was applied to the cervix and a colposcope was used to visualize the nonstaining portion.  A cone was cut out and an ECC was performed.  The cone bed was made hemostatic with electrocautery and Monsel's solution with pressure.  The patient tolerated the procedure well.  Sponge, lap, and needle counts were correct x2 per the operating room staff.  The patient was returned to supine position, awakened, and in stable condition, transferred to the PACU.    Sherron MondayJody Bovard, MD    JB/MEDQ  D:  12/02/2015  T:  12/02/2015  Job:  [id number]

## 2015-12-02 NOTE — Transfer of Care (Signed)
Immediate Anesthesia Transfer of Care Note  Patient: Brittany [name]  Procedure(s) Performed: Procedure(s): CONIZATION CERVIX WITH BIOPSY (N/A)  Patient Location: PACU  Anesthesia Type:General  Level of Consciousness: awake, alert  and oriented  Airway & Oxygen Therapy: Patient Spontanous Breathing and Patient connected to nasal cannula oxygen  Post-op Assessment: Report given to RN and Post -op Vital signs reviewed and stable  Post vital signs: Reviewed and stable  Last Vitals:  Filed Vitals:   12/02/15 0611  BP: 117/82  Pulse: 83  Temp: 36.7 C  Resp: 18    Complications: No apparent anesthesia complications

## 2015-12-02 NOTE — Anesthesia Postprocedure Evaluation (Signed)
Anesthesia Post Note  Patient: Brittany [name]  Procedure(s) Performed: Procedure(s) (LRB): CONIZATION CERVIX WITH BIOPSY (N/A)  Patient location during evaluation: PACU Anesthesia Type: General Level of consciousness: awake Pain management: pain level controlled Vital Signs Assessment: post-procedure vital signs reviewed and stable Respiratory status: spontaneous breathing Cardiovascular status: stable Postop Assessment: no signs of nausea or vomiting Anesthetic complications: no    Last Vitals:  Filed Vitals:   12/02/15 0845 12/02/15 0900  BP: 110/65 111/66  Pulse: 54 55  Temp:  36.9 C  Resp: 13 15    Last Pain: There were no vitals filed for this visit.               Joette Schmoker JR,JOHN Susann GivensFRANKLIN

## 2015-12-02 NOTE — Interval H&P Note (Signed)
History and Physical Interval Note:  12/02/2015 7:11 AM  Brittany [name]  has presented today for surgery, with the diagnosis of Carinoma in situ of cervix, iud insert  The various methods of treatment have been discussed with the patient and family. After consideration of risks, benefits and other options for treatment, the patient has consented to  Procedure(s): CONIZATION CERVIX WITH BIOPSY (N/A)  as a surgical intervention .  The patient's history has been reviewed, patient examined, no change in status, stable for surgery.  I have reviewed the patient's chart and labs.  Questions were answered to the patient's satisfaction.     [name], [name]

## 2015-12-02 NOTE — Discharge Instructions (Signed)

## 2015-12-03 ENCOUNTER — Encounter (HOSPITAL_COMMUNITY): Payer: Self-pay | Admitting: Obstetrics and Gynecology

## 2018-06-15 ENCOUNTER — Ambulatory Visit: Payer: Managed Care, Other (non HMO) | Admitting: Internal Medicine

## 2018-06-22 ENCOUNTER — Ambulatory Visit: Payer: BC Managed Care – PPO | Admitting: Internal Medicine

## 2018-06-22 ENCOUNTER — Encounter: Payer: Self-pay | Admitting: Internal Medicine

## 2018-06-22 ENCOUNTER — Encounter (INDEPENDENT_AMBULATORY_CARE_PROVIDER_SITE_OTHER): Payer: Self-pay

## 2018-06-22 VITALS — BP 112/70 | HR 69 | Temp 97.9°F | Ht 65.0 in | Wt 139.0 lb

## 2018-06-22 DIAGNOSIS — Z8742 Personal history of other diseases of the female genital tract: Secondary | ICD-10-CM | POA: Diagnosis not present

## 2018-06-22 NOTE — Patient Instructions (Signed)

## 2018-06-22 NOTE — Progress Notes (Signed)
HPI  Pt presents to the clinic today to establish care and for management of the conditions listed below. She has not had a PCP in many years, but has been following with GYN.  History of Abnormal Pap Smear: No longer follows with GYN. S/p conization, which she reports was normal.  Flu: 05/2018 Tetanus: 07/2015 Pap Smear: 2017, yearly Dentist: biannually  Past Medical History:  Diagnosis Date  . Carcinoma in situ of uterine cervix    seen by Dr Andrey Farmer of gyn onc.  rec. conization after delivery    No current outpatient medications on file.   No current facility-administered medications for this visit.     No Known Allergies  Family History  Problem Relation Age of Onset  . Diabetes Mother   . Heart failure Mother   . Alcohol abuse Maternal Grandmother   . Arthritis Neg Hx   . Asthma Neg Hx   . Birth defects Neg Hx   . Cancer Neg Hx   . COPD Neg Hx   . Depression Neg Hx   . Drug abuse Neg Hx   . Early death Neg Hx   . Hearing loss Neg Hx   . Heart disease Neg Hx   . Hyperlipidemia Neg Hx   . Hypertension Neg Hx   . Kidney disease Neg Hx   . Learning disabilities Neg Hx   . Mental illness Neg Hx   . Mental retardation Neg Hx   . Miscarriages / Stillbirths Neg Hx   . Stroke Neg Hx   . Vision loss Neg Hx   . Varicose Veins Neg Hx     Social History   Socioeconomic History  . Marital status: Single    Spouse name: Not on file  . Number of children: Not on file  . Years of education: Not on file  . Highest education level: Not on file  Occupational History  . Not on file  Social Needs  . Financial resource strain: Not on file  . Food insecurity:    Worry: Not on file    Inability: Not on file  . Transportation needs:    Medical: Not on file    Non-medical: Not on file  Tobacco Use  . Smoking status: Never Smoker  . Smokeless tobacco: Never Used  Substance and Sexual Activity  . Alcohol use: No  . Drug use: No  . Sexual activity: Yes    Birth  control/protection: Condom  Lifestyle  . Physical activity:    Days per week: Not on file    Minutes per session: Not on file  . Stress: Not on file  Relationships  . Social connections:    Talks on phone: Not on file    Gets together: Not on file    Attends religious service: Not on file    Active member of club or organization: Not on file    Attends meetings of clubs or organizations: Not on file    Relationship status: Not on file  . Intimate partner violence:    Fear of current or ex partner: Not on file    Emotionally abused: Not on file    Physically abused: Not on file    Forced sexual activity: Not on file  Other Topics Concern  . Not on file  Social History Narrative  . Not on file    ROS:  Constitutional: Denies fever, malaise, fatigue, headache or abrupt weight changes.  HEENT: Denies eye pain, eye redness, ear pain, ringing  in the ears, wax buildup, runny nose, nasal congestion, bloody nose, or sore throat. Respiratory: Denies difficulty breathing, shortness of breath, cough or sputum production.   Cardiovascular: Denies chest pain, chest tightness, palpitations or swelling in the hands or feet.  Gastrointestinal: Denies abdominal pain, bloating, constipation, diarrhea or blood in the stool.  GU: Denies frequency, urgency, pain with urination, blood in urine, odor or discharge. Musculoskeletal: Denies decrease in range of motion, difficulty with gait, muscle pain or joint pain and swelling.  Skin: Denies redness, rashes, lesions or ulcercations.  Neurological: Denies dizziness, difficulty with memory, difficulty with speech or problems with balance and coordination.  Psych: Denies anxiety, depression, SI/HI.  No other specific complaints in a complete review of systems (except as listed in HPI above).  PE:  Pulse 69   Temp 97.9 F (36.6 C) (Oral)   Ht 5\' 5"  (1.651 m)   Wt 139 lb (63 kg)   LMP 06/07/2018   SpO2 98%   BMI 23.13 kg/m  Wt Readings from Last  3 Encounters:  06/22/18 139 lb (63 kg)  11/27/15 130 lb (59 kg)  10/03/15 146 lb 3.2 oz (66.3 kg)    General: Appears her stated age, well developed, well nourished in NAD. Skin: Dry and intact. Cardiovascular: Normal rate and rhythm. S1,S2 noted.  No murmur, rubs or gallops noted. No JVD or BLE edema.  Pulmonary/Chest: Normal effort and positive vesicular breath sounds. No respiratory distress. No wheezes, rales or ronchi noted.  Neurological: Alert and oriented.  Psychiatric: Mood and affect normal. Behavior is normal. Judgment and thought content normal.     BMET    Component Value Date/Time   NA 137 10/04/2014 1450   K 4.2 10/04/2014 1450   CL 102 10/04/2014 1450   CO2 31 10/04/2014 1450   GLUCOSE 132 (H) 10/04/2014 1450   BUN 6 10/04/2014 1450   CREATININE 0.66 10/04/2014 1450   CALCIUM 9.5 10/04/2014 1450   GFRNONAA >90 10/04/2014 1450   GFRAA >90 10/04/2014 1450    Lipid Panel  No results found for: CHOL, TRIG, HDL, CHOLHDL, VLDL, LDLCALC  CBC    Component Value Date/Time   WBC 4.9 12/02/2015 0615   RBC 4.21 12/02/2015 0615   HGB 11.9 (L) 12/02/2015 0615   HCT 35.5 (L) 12/02/2015 0615   PLT 165 12/02/2015 0615   MCV 84.3 12/02/2015 0615   MCH 28.3 12/02/2015 0615   MCHC 33.5 12/02/2015 0615   RDW 13.6 12/02/2015 0615   LYMPHSABS 4.9 (H) 10/03/2014 1804   MONOABS 0.6 10/03/2014 1804   EOSABS 0.0 10/03/2014 1804   BASOSABS 0.3 (H) 10/03/2014 1804    Hgb A1C No results found for: HGBA1C   Assessment and Plan:  Abnormal Pap Smears:  Past due Advised her to make an appt for her annual exam and I would do her pap at that time  Return precautions discussed Nicki Reaper, NP

## 2018-08-14 ENCOUNTER — Other Ambulatory Visit: Payer: BC Managed Care – PPO

## 2018-08-17 ENCOUNTER — Encounter: Payer: BC Managed Care – PPO | Admitting: Internal Medicine

## 2018-08-22 ENCOUNTER — Telehealth: Payer: Self-pay

## 2018-08-22 NOTE — Telephone Encounter (Signed)
Pt called back; [facility] [employer] and wants to know if she should work on 08/23/18. Last vomiting and diarrhea was earlier this morning after taking Zofran.pt still has slight low abd pain but pt has slept most of the day. Pt does not know if she has fever or not. Pt missed work on 08/21/18 and if does not work on Wed will have missed 08/23/18.advised may need to be without fever  N&V& diarrhea for at least 24 hrs but will ask Pamala Hurry Baity NP. Pt also wants note to cover being out of work. Advised pt would need to be seen. Pt scheduled appt on 08/23/18 at 2 PM. FYI to Pamala Hurry Baity NP.

## 2018-08-22 NOTE — Telephone Encounter (Signed)
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 I will see her tomorrow.

## 2018-08-22 NOTE — Telephone Encounter (Signed)
[facility] [employer] faxed a note;Pt having vomiting,diarrhea and abd pain with diarrhea since 08/21/18 in afternoon. No fever per [facility] note. Pt was given zofran 4 mg. Unable to reach pt for update on condition. Pt called back; pt said she has not gotten zofran yet. Pt has not taken temp so not sure if fever or not. Pt feels slightly better than last night. Last  Loose diarrhea was 6 AM this AM. pts son has same symptoms. Pt cannot keep any liquid or food down but hopes when takes zofran that will change. Pt wants to try med and then will cb for appt if needed. FYI to R BaityNP. TH note in R Baity NP in box.

## 2018-08-22 NOTE — Telephone Encounter (Signed)
noted 

## 2018-08-23 ENCOUNTER — Ambulatory Visit: Payer: BC Managed Care – PPO | Admitting: Internal Medicine

## 2018-08-23 ENCOUNTER — Encounter: Payer: Self-pay | Admitting: Internal Medicine

## 2018-08-23 VITALS — BP 112/68 | HR 76 | Temp 97.9°F | Wt 137.0 lb

## 2018-08-23 DIAGNOSIS — A084 Viral intestinal infection, unspecified: Secondary | ICD-10-CM

## 2018-08-23 NOTE — Telephone Encounter (Signed)
Pt is not going to work today

## 2018-08-23 NOTE — Progress Notes (Signed)
Subjective:    Patient ID: Brittany [name], female    DOB: 1986-07-20, 32 y.o.   MRN: [id number]  HPI  Pt presents to the clinic today with c/o abdominal pain, nausea, vomiting and diarrhea. She reports this started 2 days ago, but has not had any issues since lunch time yesterday. She was given Zofran by the on call MD. It helped with the nausea and vomiting. She has not had any diarrhea since yesterday. She denies blood in her stool. She denies fever, chills or body aches. She has had sick contacts with similar symptoms. She does need a work note.  Review of Systems      Past Medical History:  Diagnosis Date  . Carcinoma in situ of uterine cervix    seen by Dr Andrey Farmer of gyn onc.  rec. conization after delivery    No current outpatient medications on file.   No current facility-administered medications for this visit.     No Known Allergies  Family History  Problem Relation Age of Onset  . Diabetes Mother   . Heart failure Mother   . Alcohol abuse Maternal Grandmother   . Arthritis Neg Hx   . Asthma Neg Hx   . Birth defects Neg Hx   . Cancer Neg Hx   . COPD Neg Hx   . Depression Neg Hx   . Drug abuse Neg Hx   . Early death Neg Hx   . Hearing loss Neg Hx   . Heart disease Neg Hx   . Hyperlipidemia Neg Hx   . Hypertension Neg Hx   . Kidney disease Neg Hx   . Learning disabilities Neg Hx   . Mental illness Neg Hx   . Mental retardation Neg Hx   . Miscarriages / Stillbirths Neg Hx   . Stroke Neg Hx   . Vision loss Neg Hx   . Varicose Veins Neg Hx     Social History   Socioeconomic History  . Marital status: Single    Spouse name: Not on file  . Number of children: Not on file  . Years of education: Not on file  . Highest education level: Not on file  Occupational History  . Not on file  Social Needs  . Financial resource strain: Not on file  . Food insecurity:    Worry: Not on file    Inability: Not on file  . Transportation needs:    Medical: Not on  file    Non-medical: Not on file  Tobacco Use  . Smoking status: Never Smoker  . Smokeless tobacco: Never Used  Substance and Sexual Activity  . Alcohol use: No  . Drug use: No  . Sexual activity: Yes    Birth control/protection: Condom  Lifestyle  . Physical activity:    Days per week: Not on file    Minutes per session: Not on file  . Stress: Not on file  Relationships  . Social connections:    Talks on phone: Not on file    Gets together: Not on file    Attends religious service: Not on file    Active member of club or organization: Not on file    Attends meetings of clubs or organizations: Not on file    Relationship status: Not on file  . Intimate partner violence:    Fear of current or ex partner: Not on file    Emotionally abused: Not on file    Physically abused: Not on file  Forced sexual activity: Not on file  Other Topics Concern  . Not on file  Social History Narrative  . Not on file     Constitutional: Denies fever, malaise, fatigue, headache or abrupt weight changes.  Respiratory: Denies difficulty breathing, shortness of breath, cough or sputum production.   Cardiovascular: Denies chest pain, chest tightness, palpitations or swelling in the hands or feet.  Gastrointestinal: Pt reports abdominal pain, nausea, vomiting, diarrhea. Denies bloating, constipation, or blood in the stool.    No other specific complaints in a complete review of systems (except as listed in HPI above).  Objective:   Physical Exam  BP 112/68   Pulse 76   Temp 97.9 F (36.6 C) (Oral)   Wt 137 lb (62.1 kg)   LMP 08/17/2018   SpO2 98%   BMI 22.80 kg/m  Wt Readings from Last 3 Encounters:  08/23/18 137 lb (62.1 kg)  06/22/18 139 lb (63 kg)  11/27/15 130 lb (59 kg)    General: Appears her stated age, well developed, well nourished in NAD. Cardiovascular: Normal rate and rhythm. S1,S2 noted.  No murmur, rubs or gallops noted.  Pulmonary/Chest: Normal effort and positive  vesicular breath sounds. No respiratory distress. No wheezes, rales or ronchi noted.  Abdomen: Soft and nontender. Normal bowel sounds. No distention or masses noted.    BMET    Component Value Date/Time   NA 137 10/04/2014 1450   K 4.2 10/04/2014 1450   CL 102 10/04/2014 1450   CO2 31 10/04/2014 1450   GLUCOSE 132 (H) 10/04/2014 1450   BUN 6 10/04/2014 1450   CREATININE 0.66 10/04/2014 1450   CALCIUM 9.5 10/04/2014 1450   GFRNONAA >90 10/04/2014 1450   GFRAA >90 10/04/2014 1450    Lipid Panel  No results found for: CHOL, TRIG, HDL, CHOLHDL, VLDL, LDLCALC  CBC    Component Value Date/Time   WBC 4.9 12/02/2015 0615   RBC 4.21 12/02/2015 0615   HGB 11.9 (L) 12/02/2015 0615   HCT 35.5 (L) 12/02/2015 0615   PLT 165 12/02/2015 0615   MCV 84.3 12/02/2015 0615   MCH 28.3 12/02/2015 0615   MCHC 33.5 12/02/2015 0615   RDW 13.6 12/02/2015 0615   LYMPHSABS 4.9 (H) 10/03/2014 1804   MONOABS 0.6 10/03/2014 1804   EOSABS 0.0 10/03/2014 1804   BASOSABS 0.3 (H) 10/03/2014 1804    Hgb A1C No results found for: HGBA1C          Assessment & Plan:   Viral Gastroenteritis:  Resolved Encouraged adequate fluid intake, bland diet for the next 48 hours Encouraged thorough handwashing to prevent spread of virus Work note provided  Return precautions discussed Nicki Reaperegina Fiore Detjen, NP

## 2018-08-23 NOTE — Patient Instructions (Signed)
Viral Gastroenteritis, Adult    Viral gastroenteritis is also known as the stomach flu. This condition is caused by certain germs (viruses). These germs can be passed from person to person very easily (are very contagious). This condition can cause sudden watery poop (diarrhea), fever, and throwing up (vomiting).  Having watery poop and throwing up can make you feel weak and cause you to get dehydrated. Dehydration can make you tired and thirsty, make you have a dry mouth, and make it so you pee (urinate) less often. Older adults and people with other diseases or a weak defense system (immune system) are at higher risk for dehydration. It is important to replace the fluids that you lose from having watery poop and throwing up.  Follow these instructions at home:  Follow instructions from your doctor about how to care for yourself at home.  Eating and drinking  Follow these instructions as told by your doctor:   Take an oral rehydration solution (ORS). This is a drink that is sold at pharmacies and stores.   Drink clear fluids in small amounts as you are able, such as:  ? Water.  ? Ice chips.  ? Diluted fruit juice.  ? Low-calorie sports drinks.   Eat bland, easy-to-digest foods in small amounts as you are able, such as:  ? Bananas.  ? Applesauce.  ? Rice.  ? Low-fat (lean) meats.  ? Toast.  ? Crackers.   Avoid fluids that have a lot of sugar or caffeine in them.   Avoid alcohol.   Avoid spicy or fatty foods.  General instructions     Drink enough fluid to keep your pee (urine) clear or pale yellow.   Wash your hands often. If you cannot use soap and water, use hand sanitizer.   Make sure that all people in your home wash their hands well and often.   Rest at home while you get better.   Take over-the-counter and prescription medicines only as told by your doctor.   Watch your condition for any changes.   Take a warm bath to help with any burning or pain from having watery poop.   Keep all follow-up  visits as told by your doctor. This is important.  Contact a doctor if:   You cannot keep fluids down.   Your symptoms get worse.   You have new symptoms.   You feel light-headed or dizzy.   You have muscle cramps.  Get help right away if:   You have chest pain.   You feel very weak or you pass out (faint).   You see blood in your throw-up.   Your throw-up looks like coffee grounds.   You have bloody or black poop (stools) or poop that look like tar.   You have a very bad headache, a stiff neck, or both.   You have a rash.   You have very bad pain, cramping, or bloating in your belly (abdomen).   You have trouble breathing.   You are breathing very quickly.   Your heart is beating very quickly.   Your skin feels cold and clammy.   You feel confused.   You have pain when you pee.   You have signs of dehydration, such as:  ? Dark pee, hardly any pee, or no pee.  ? Cracked lips.  ? Dry mouth.  ? Sunken eyes.  ? Sleepiness.  ? Weakness.  This information is not intended to replace advice given to you by your   health care provider. Make sure you discuss any questions you have with your health care provider.  Document Released: 02/09/2008 Document Revised: 05/17/2018 Document Reviewed: 04/29/2015  Elsevier Interactive Patient Education  2019 [employer]

## 2018-10-09 ENCOUNTER — Ambulatory Visit (INDEPENDENT_AMBULATORY_CARE_PROVIDER_SITE_OTHER): Payer: BC Managed Care – PPO | Admitting: Internal Medicine

## 2018-10-09 ENCOUNTER — Other Ambulatory Visit (HOSPITAL_COMMUNITY)
Admission: RE | Admit: 2018-10-09 | Discharge: 2018-10-09 | Disposition: A | Discharge status: Home / Self Care | Payer: BC Managed Care – PPO | Source: Ambulatory Visit | Attending: Internal Medicine | Admitting: Internal Medicine

## 2018-10-09 ENCOUNTER — Encounter: Payer: Self-pay | Admitting: Internal Medicine

## 2018-10-09 VITALS — BP 116/72 | HR 63 | Temp 97.9°F | Ht 65.0 in | Wt 140.0 lb

## 2018-10-09 DIAGNOSIS — Z124 Encounter for screening for malignant neoplasm of cervix: Secondary | ICD-10-CM | POA: Diagnosis not present

## 2018-10-09 DIAGNOSIS — Z113 Encounter for screening for infections with a predominantly sexual mode of transmission: Secondary | ICD-10-CM | POA: Insufficient documentation

## 2018-10-09 DIAGNOSIS — Z0000 Encounter for general adult medical examination without abnormal findings: Secondary | ICD-10-CM

## 2018-10-09 NOTE — Progress Notes (Signed)
Subjective:    Patient ID: Brittany [name], female    DOB: 04/25/1986, 33 y.o.   MRN: [id number]  HPI  Pt presents to the clinic today for her annual exam.  Flu: 05/2018 Tetanus: 07/2015 Pap Smear: ? 2017, hx of abnormal pap smears Dentist: annually  Diet: She does eat meat. She consumes fruits and veggies daily. She does eat fried foods. She drinks mostly soda. Exercise: Walking   Review of Systems      Past Medical History:  Diagnosis Date  . Carcinoma in situ of uterine cervix    seen by Dr Andrey Farmerossi of gyn onc.  rec. conization after delivery    No current outpatient medications on file.   No current facility-administered medications for this visit.     No Known Allergies  Family History  Problem Relation Age of Onset  . Diabetes Mother   . Heart failure Mother   . Alcohol abuse Maternal Grandmother   . Arthritis Neg Hx   . Asthma Neg Hx   . Birth defects Neg Hx   . Cancer Neg Hx   . COPD Neg Hx   . Depression Neg Hx   . Drug abuse Neg Hx   . Early death Neg Hx   . Hearing loss Neg Hx   . Heart disease Neg Hx   . Hyperlipidemia Neg Hx   . Hypertension Neg Hx   . Kidney disease Neg Hx   . Learning disabilities Neg Hx   . Mental illness Neg Hx   . Mental retardation Neg Hx   . Miscarriages / Stillbirths Neg Hx   . Stroke Neg Hx   . Vision loss Neg Hx   . Varicose Veins Neg Hx     Social History   Socioeconomic History  . Marital status: Single    Spouse name: Not on file  . Number of children: Not on file  . Years of education: Not on file  . Highest education level: Not on file  Occupational History  . Not on file  Social Needs  . Financial resource strain: Not on file  . Food insecurity:    Worry: Not on file    Inability: Not on file  . Transportation needs:    Medical: Not on file    Non-medical: Not on file  Tobacco Use  . Smoking status: Never Smoker  . Smokeless tobacco: Never Used  Substance and Sexual Activity  . Alcohol use: No    . Drug use: No  . Sexual activity: Yes    Birth control/protection: Condom  Lifestyle  . Physical activity:    Days per week: Not on file    Minutes per session: Not on file  . Stress: Not on file  Relationships  . Social connections:    Talks on phone: Not on file    Gets together: Not on file    Attends religious service: Not on file    Active member of club or organization: Not on file    Attends meetings of clubs or organizations: Not on file    Relationship status: Not on file  . Intimate partner violence:    Fear of current or ex partner: Not on file    Emotionally abused: Not on file    Physically abused: Not on file    Forced sexual activity: Not on file  Other Topics Concern  . Not on file  Social History Narrative  . Not on file  Constitutional: Denies fever, malaise, fatigue, headache or abrupt weight changes.  HEENT: Denies eye pain, eye redness, ear pain, ringing in the ears, wax buildup, runny nose, nasal congestion, bloody nose, or sore throat. Respiratory: Denies difficulty breathing, shortness of breath, cough or sputum production.   Cardiovascular: Denies chest pain, chest tightness, palpitations or swelling in the hands or feet.  Gastrointestinal: Denies abdominal pain, bloating, constipation, diarrhea or blood in the stool.  GU: Denies urgency, frequency, pain with urination, burning sensation, blood in urine, odor or discharge. Musculoskeletal: Denies decrease in range of motion, difficulty with gait, muscle pain or joint pain and swelling.  Skin: Denies redness, rashes, lesions or ulcercations.  Neurological: Denies dizziness, difficulty with memory, difficulty with speech or problems with balance and coordination.  Psych: Denies anxiety, depression, SI/HI.  No other specific complaints in a complete review of systems (except as listed in HPI above).  Objective:   Physical Exam   BP 116/72   Pulse 63   Temp 97.9 F (36.6 C) (Oral)   Ht 5\' 5"   (1.651 m)   Wt 140 lb (63.5 kg)   LMP 09/17/2018   SpO2 99%   BMI 23.30 kg/m  Wt Readings from Last 3 Encounters:  10/09/18 140 lb (63.5 kg)  08/23/18 137 lb (62.1 kg)  06/22/18 139 lb (63 kg)    General: Appears her stated age, well developed, well nourished in NAD. Skin: Warm, dry and intact.  HEENT: Head: normal shape and size; Eyes: sclera white, no icterus, conjunctiva pink, PERRLA and EOMs intact; Ears: Tm's gray and intact, normal light reflex;  Throat/Mouth: Teeth present, mucosa pink and moist, no exudate, lesions or ulcerations noted.  Neck:  Neck supple, trachea midline. No masses, lumps or thyromegaly present.  Cardiovascular: Normal rate and rhythm. S1,S2 noted.  No murmur, rubs or gallops noted. No JVD or BLE edema.  Pulmonary/Chest: Normal effort and positive vesicular breath sounds. No respiratory distress. No wheezes, rales or ronchi noted.  Abdomen: Soft and nontender. Normal bowel sounds. No distention or masses noted. Liver, spleen and kidneys non palpable. Pelvic: Normal female anatomy. Cervix not visualized. No CMT. Adnexa non palpable. Musculoskeletal: Strength 5/5 BUE/BLE. No difficulty with gait.  Neurological: Alert and oriented. Cranial nerves II-XII grossly intact. Coordination normal.  Psychiatric: Mood and affect normal. Behavior is normal. Judgment and thought content normal.    BMET    Component Value Date/Time   NA 137 10/04/2014 1450   K 4.2 10/04/2014 1450   CL 102 10/04/2014 1450   CO2 31 10/04/2014 1450   GLUCOSE 132 (H) 10/04/2014 1450   BUN 6 10/04/2014 1450   CREATININE 0.66 10/04/2014 1450   CALCIUM 9.5 10/04/2014 1450   GFRNONAA >90 10/04/2014 1450   GFRAA >90 10/04/2014 1450    Lipid Panel  No results found for: CHOL, TRIG, HDL, CHOLHDL, VLDL, LDLCALC  CBC    Component Value Date/Time   WBC 4.9 12/02/2015 0615   RBC 4.21 12/02/2015 0615   HGB 11.9 (L) 12/02/2015 0615   HCT 35.5 (L) 12/02/2015 0615   PLT 165 12/02/2015 0615     MCV 84.3 12/02/2015 0615   MCH 28.3 12/02/2015 0615   MCHC 33.5 12/02/2015 0615   RDW 13.6 12/02/2015 0615   LYMPHSABS 4.9 (H) 10/03/2014 1804   MONOABS 0.6 10/03/2014 1804   EOSABS 0.0 10/03/2014 1804   BASOSABS 0.3 (H) 10/03/2014 1804    Hgb A1C No results found for: HGBA1C  Assessment & Plan:   Preventative Health Maintenance:  Flu shot UTD Tetanus UTD Pap smear today with STD screening Encouraged her to consume a balanced diet and exercise regimen Advised her to see a dentist annually Will check CBC, CMET, Lipid. She declines HIV or RPR  RTC in 1 year, sooner if needed Nicki Reaperegina Duke Weisensel, NP

## 2018-10-09 NOTE — Addendum Note (Signed)
Addended by: Roena Malady on: 10/09/2018 03:28 PM   Modules accepted: Orders

## 2018-10-09 NOTE — Patient Instructions (Signed)

## 2018-10-10 LAB — LIPID PANEL
CHOL/HDL RATIO: 4
Cholesterol: 185 mg/dL (ref 0–200)
HDL: 49.1 mg/dL (ref >39.00)
LDL Cholesterol: 119 mg/dL — ABNORMAL HIGH (ref 0–99)
NONHDL: 136.24
Triglycerides: 86 mg/dL (ref 0.0–149.0)
VLDL: 17.2 mg/dL (ref 0.0–40.0)

## 2018-10-10 LAB — CBC
HCT: 41.1 % (ref 36.0–46.0)
HEMOGLOBIN: 13.8 g/dL (ref 12.0–15.0)
MCHC: 33.5 g/dL (ref 30.0–36.0)
MCV: 83.7 fl (ref 78.0–100.0)
Platelets: 183 10*3/uL (ref 150.0–400.0)
RBC: 4.9 Mil/uL (ref 3.87–5.11)
RDW: 13.4 % (ref 11.5–15.5)
WBC: 6.3 10*3/uL (ref 4.0–10.5)

## 2018-10-10 LAB — COMPREHENSIVE METABOLIC PANEL
ALT: 12 U/L (ref 0–35)
AST: 13 U/L (ref 0–37)
Albumin: 4.5 g/dL (ref 3.5–5.2)
Alkaline Phosphatase: 53 U/L (ref 39–117)
BILIRUBIN TOTAL: 1.4 mg/dL — AB (ref 0.2–1.2)
BUN: 12 mg/dL (ref 6–23)
CO2: 28 meq/L (ref 19–32)
Calcium: 9.6 mg/dL (ref 8.4–10.5)
Chloride: 103 mEq/L (ref 96–112)
Creatinine, Ser: 0.57 mg/dL (ref 0.40–1.20)
GFR: 123.18 mL/min (ref >60.00)
GLUCOSE: 91 mg/dL (ref 70–99)
Potassium: 3.7 mEq/L (ref 3.5–5.1)
SODIUM: 139 meq/L (ref 135–145)
Total Protein: 7.3 g/dL (ref 6.0–8.3)

## 2018-10-14 LAB — CYTOLOGY - PAP
Adequacy: ABSENT
Bacterial vaginitis: POSITIVE — AB
CHLAMYDIA, DNA PROBE: NEGATIVE
Candida vaginitis: NEGATIVE
DIAGNOSIS: NEGATIVE
HPV (WINDOPATH): NOT DETECTED
Neisseria Gonorrhea: NEGATIVE
Trichomonas: NEGATIVE

## 2018-10-17 ENCOUNTER — Telehealth: Payer: Self-pay | Admitting: Internal Medicine

## 2018-10-17 NOTE — Telephone Encounter (Signed)
Best number [phone]  Pt returned you call

## 2018-10-18 MED ORDER — METRONIDAZOLE 0.75 % VA GEL: 1.0000 | Freq: Two times a day (BID) | VAGINAL | 0 refills | Status: DC

## 2018-10-18 NOTE — Telephone Encounter (Signed)
Pt returning your call

## 2018-10-18 NOTE — Addendum Note (Signed)
Addended by: Roena Malady on: 10/18/2018 02:50 PM   Modules accepted: Orders

## 2019-02-02 ENCOUNTER — Encounter: Payer: Self-pay | Admitting: *Deleted

## 2019-02-02 ENCOUNTER — Ambulatory Visit (INDEPENDENT_AMBULATORY_CARE_PROVIDER_SITE_OTHER)
Admission: RE | Admit: 2019-02-02 | Discharge: 2019-02-02 | Disposition: A | Discharge status: Home / Self Care | Payer: BC Managed Care – PPO | Source: Ambulatory Visit | Attending: Family Medicine | Admitting: Family Medicine

## 2019-02-02 ENCOUNTER — Ambulatory Visit: Payer: BC Managed Care – PPO | Admitting: Family Medicine

## 2019-02-02 ENCOUNTER — Other Ambulatory Visit: Payer: Self-pay

## 2019-02-02 ENCOUNTER — Encounter: Payer: Self-pay | Admitting: Family Medicine

## 2019-02-02 DIAGNOSIS — M79675 Pain in left toe(s): Secondary | ICD-10-CM | POA: Diagnosis not present

## 2019-02-02 DIAGNOSIS — M79672 Pain in left foot: Secondary | ICD-10-CM | POA: Insufficient documentation

## 2019-02-02 LAB — URIC ACID: Uric Acid, Serum: 4.4 mg/dL (ref 2.4–7.0)

## 2019-02-02 MED ORDER — PREDNISONE 20 MG PO TABS: ORAL_TABLET | ORAL | 0 refills | Status: DC

## 2019-02-02 NOTE — Assessment & Plan Note (Signed)
Most consistent with gout but pt with no personal or family history.. eval with labs.  Eval with X-ray to rule out fracture given weight bearing on feet daily.  Treat with prednisone taper, elevation and ice.

## 2019-02-02 NOTE — Progress Notes (Signed)
Chief Complaint  Patient presents with  . Foot Pain    Left    History of Present Illness: HPI 33 year old female pt of Nicki ReaperRegina Baity presents with new onset  Left foot pain.  She reports that she noted 1 week of pain. No known injury, fall, change in activity. Pain in in left great toe, swelling , severe pain, tender to light touch. Area looks red, warm.  She has tried ibuprofen 400 mg  Off and on ... didn't help.  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 Wears  Tennis shoes.  Hx: no history of foot problem. Family:  No gout runs in family.  COVID 19 screen No recent travel or known exposure to COVID19 The patient denies respiratory symptoms of COVID 19 at this time.  The importance of social distancing was discussed today.   Review of Systems  Constitutional: Negative for chills and fever.  HENT: Negative for congestion and ear pain.   Eyes: Negative for pain and redness.  Respiratory: Negative for cough and shortness of breath.   Cardiovascular: Negative for chest pain, palpitations and leg swelling.  Gastrointestinal: Negative for abdominal pain, blood in stool, constipation, diarrhea, nausea and vomiting.  Genitourinary: Negative for dysuria.  Musculoskeletal: Negative for falls and myalgias.  Skin: Negative for rash.  Neurological: Negative for dizziness.  Psychiatric/Behavioral: Negative for depression. The patient is not nervous/anxious.       Past Medical History:  Diagnosis Date  . Carcinoma in situ of uterine cervix    seen by Dr Andrey Farmerossi of gyn onc.  rec. conization after delivery    reports that she has never smoked. She has never used smokeless tobacco. She reports that she does not drink alcohol or use drugs.   Current Outpatient Medications:  .  levonorgestrel (MIRENA, 52 MG,) 20 MCG/24HR IUD, Mirena 20 mcg/24 hours (5 yrs) 52 mg intrauterine device  Take 1 device by intrauterine route., Disp: , Rfl:    Observations/Objective: Blood pressure 104/68,  pulse 89, temperature 98.5 F (36.9 C), temperature source Oral, height 5\' 5"  (1.651 m), weight 155 lb (70.3 kg), last menstrual period 01/11/2019, currently breastfeeding.  Physical Exam Constitutional:      General: She is not in acute distress.    Appearance: Normal appearance. She is well-developed. She is not ill-appearing or toxic-appearing.  HENT:     Head: Normocephalic.     Right Ear: Hearing, tympanic membrane, ear canal and external ear normal. Tympanic membrane is not erythematous, retracted or bulging.     Left Ear: Hearing, tympanic membrane, ear canal and external ear normal. Tympanic membrane is not erythematous, retracted or bulging.     Nose: No mucosal edema or rhinorrhea.     Right Sinus: No maxillary sinus tenderness or frontal sinus tenderness.     Left Sinus: No maxillary sinus tenderness or frontal sinus tenderness.     Mouth/Throat:     Pharynx: Uvula midline.  Eyes:     General: Lids are normal. Lids are everted, no foreign bodies appreciated.     Conjunctiva/sclera: Conjunctivae normal.     Pupils: Pupils are equal, round, and reactive to light.  Neck:     Musculoskeletal: Normal range of motion and neck supple.     Thyroid: No thyroid mass or thyromegaly.     Vascular: No carotid bruit.     Trachea: Trachea normal.  Cardiovascular:     Rate and Rhythm: Normal rate and regular rhythm.  Pulses: Normal pulses.     Heart sounds: Normal heart sounds, S1 normal and S2 normal. No murmur. No friction rub. No gallop.   Pulmonary:     Effort: Pulmonary effort is normal. No tachypnea or respiratory distress.     Breath sounds: Normal breath sounds. No decreased breath sounds, wheezing, rhonchi or rales.  Abdominal:     General: Bowel sounds are normal.     Palpations: Abdomen is soft.     Tenderness: There is no abdominal tenderness.  Feet:     Comments:  Focal ttp , warmth and slight redenss in  1 toe MTP joint. Diffuse swelling in left foot, mainly at left   First MCP jpoint Skin:    General: Skin is warm and dry.     Findings: No rash.  Neurological:     Mental Status: She is alert.  Psychiatric:        Mood and Affect: Mood is not anxious or depressed.        Speech: Speech normal.        Behavior: Behavior normal. Behavior is cooperative.        Thought Content: Thought content normal.        Judgment: Judgment normal.      Assessment and Plan  Great toe pain, left Most consistent with gout but pt with no personal or family history.. eval with labs.  Eval with X-ray to rule out fracture given weight bearing on feet daily.  Treat with prednisone taper, elevation and ice.   Kerby Nora, MD

## 2019-02-02 NOTE — Patient Instructions (Addendum)
We will call you with labs results.  Elevate foot and ice foot, limit weight bearing.  Complete prednisone taper.

## 2019-10-12 ENCOUNTER — Encounter: Payer: Self-pay | Admitting: Internal Medicine

## 2019-10-12 ENCOUNTER — Ambulatory Visit (INDEPENDENT_AMBULATORY_CARE_PROVIDER_SITE_OTHER): Payer: BC Managed Care – PPO | Admitting: Internal Medicine

## 2019-10-12 ENCOUNTER — Other Ambulatory Visit: Payer: Self-pay

## 2019-10-12 VITALS — BP 118/76 | HR 77 | Temp 98.1°F | Ht 64.75 in | Wt 157.0 lb

## 2019-10-12 DIAGNOSIS — F5101 Primary insomnia: Secondary | ICD-10-CM | POA: Diagnosis not present

## 2019-10-12 DIAGNOSIS — G4700 Insomnia, unspecified: Secondary | ICD-10-CM | POA: Insufficient documentation

## 2019-10-12 DIAGNOSIS — Z0000 Encounter for general adult medical examination without abnormal findings: Secondary | ICD-10-CM

## 2019-10-12 MED ORDER — TRAZODONE HCL 50 MG PO TABS: 25.0000 mg | ORAL_TABLET | Freq: Every evening | ORAL | 0 refills | Status: DC | PRN

## 2019-10-12 NOTE — Assessment & Plan Note (Signed)
Will trial Trazadone, RX sent to pharmacy Advised her to let me know how this works for her

## 2019-10-12 NOTE — Progress Notes (Signed)
Subjective:    Patient ID: Brittany [name], female    DOB: 28-May-1986, 34 y.o.   MRN: [id number]  HPI  Pt presents to the clinic today for her annual exam.  She also reports insomnia. This started 3-4 months ago. She has difficulty falling asleep and staying asleep. She does not snore, waking up coughing or gasping for air. She has tried Melatonin with minimal relief.  Flu: 05/2019 Tetanus: 07/2015 COVID: 09/11/2019, 10/09/2019 Pap Smear: 10/2018 Dentist: annually  Diet: She does eat meat. She consumes fruits and veggies most days. She does eat some fried foods. She drinks mostly coffee, water, soda. Exercise: None  Review of Systems      Past Medical History:  Diagnosis Date  . Carcinoma in situ of uterine cervix    seen by Dr Andrey Farmer of gyn onc.  rec. conization after delivery    Current Outpatient Medications  Medication Sig Dispense Refill  . levonorgestrel (MIRENA, 52 MG,) 20 MCG/24HR IUD Mirena 20 mcg/24 hours (5 yrs) 52 mg intrauterine device  Take 1 device by intrauterine route.    . predniSONE (DELTASONE) 20 MG tablet 3 tabs by mouth daily x 3 days, then 2 tabs by mouth daily x 2 days then 1 tab by mouth daily x 2 days 15 tablet 0   No current facility-administered medications for this visit.    No Known Allergies  Family History  Problem Relation Age of Onset  . Diabetes Mother   . Heart failure Mother   . Alcohol abuse Maternal Grandmother   . Arthritis Neg Hx   . Asthma Neg Hx   . Birth defects Neg Hx   . Cancer Neg Hx   . COPD Neg Hx   . Depression Neg Hx   . Drug abuse Neg Hx   . Early death Neg Hx   . Hearing loss Neg Hx   . Heart disease Neg Hx   . Hyperlipidemia Neg Hx   . Hypertension Neg Hx   . Kidney disease Neg Hx   . Learning disabilities Neg Hx   . Mental illness Neg Hx   . Mental retardation Neg Hx   . Miscarriages / Stillbirths Neg Hx   . Stroke Neg Hx   . Vision loss Neg Hx   . Varicose Veins Neg Hx     Social History    Socioeconomic History  . Marital status: Married    Spouse name: Not on file  . Number of children: Not on file  . Years of education: Not on file  . Highest education level: Not on file  Occupational History  . Not on file  Tobacco Use  . Smoking status: Never Smoker  . Smokeless tobacco: Never Used  Substance and Sexual Activity  . Alcohol use: No  . Drug use: No  . Sexual activity: Yes    Birth control/protection: Condom  Other Topics Concern  . Not on file  Social History Narrative  . Not on file   Social Determinants of Health   Financial Resource Strain:   . Difficulty of Paying Living Expenses: Not on file  Food Insecurity:   . Worried About Programme researcher, broadcasting/film/video in the Last Year: Not on file  . Ran Out of Food in the Last Year: Not on file  Transportation Needs:   . Lack of Transportation (Medical): Not on file  . Lack of Transportation (Non-Medical): Not on file  Physical Activity:   . Days of Exercise per Week:  Not on file  . Minutes of Exercise per Session: Not on file  Stress:   . Feeling of Stress : Not on file  Social Connections:   . Frequency of Communication with Friends and Family: Not on file  . Frequency of Social Gatherings with Friends and Family: Not on file  . Attends Religious Services: Not on file  . Active Member of Clubs or Organizations: Not on file  . Attends Archivist Meetings: Not on file  . Marital Status: Not on file  Intimate Partner Violence:   . Fear of Current or Ex-Partner: Not on file  . Emotionally Abused: Not on file  . Physically Abused: Not on file  . Sexually Abused: Not on file     Constitutional: Denies fever, malaise, fatigue, headache or abrupt weight changes.  HEENT: Denies eye pain, eye redness, ear pain, ringing in the ears, wax buildup, runny nose, nasal congestion, bloody nose, or sore throat. Respiratory: Denies difficulty breathing, shortness of breath, cough or sputum production.    Cardiovascular: Denies chest pain, chest tightness, palpitations or swelling in the hands or feet.  Gastrointestinal: Denies abdominal pain, bloating, constipation, diarrhea or blood in the stool.  GU: Denies urgency, frequency, pain with urination, burning sensation, blood in urine, odor or discharge. Musculoskeletal: Denies decrease in range of motion, difficulty with gait, muscle pain or joint pain and swelling.  Skin: Denies redness, rashes, lesions or ulcercations.  Neurological: Pt reports insomnia. Denies dizziness, difficulty with memory, difficulty with speech or problems with balance and coordination.  Psych: Denies anxiety, depression, SI/HI.  No other specific complaints in a complete review of systems (except as listed in HPI above).  Objective:   Physical Exam BP 118/76   Pulse 77   Temp 98.1 F (36.7 C) (Temporal)   Ht 5' 4.75" (1.645 m)   Wt 157 lb (71.2 kg)   SpO2 98%   BMI 26.33 kg/m   Wt Readings from Last 3 Encounters:  02/02/19 155 lb (70.3 kg)  10/09/18 140 lb (63.5 kg)  08/23/18 137 lb (62.1 kg)    General: Appears her stated age, well developed, well nourished in NAD. Skin: Warm, dry and intact. No rashes, lesions or ulcerations noted. HEENT: Head: normal shape and size; Eyes: sclera white, no icterus, conjunctiva pink, PERRLA and EOMs intact;  Neck:  Neck supple, trachea midline. No masses, lumps or thyromegaly present.  Cardiovascular: Normal rate and rhythm. S1,S2 noted.  No murmur, rubs or gallops noted. No JVD or BLE edema.  Pulmonary/Chest: Normal effort and positive vesicular breath sounds. No respiratory distress. No wheezes, rales or ronchi noted.  Abdomen: Soft and nontender. Normal bowel sounds. No distention or masses noted. Liver, spleen and kidneys non palpable. Musculoskeletal: Strength 5/5 BUE/BLE. No difficulty with gait.  Neurological: Alert and oriented. Cranial nerves II-XII grossly intact. Coordination normal.  Psychiatric: Mood and  affect normal. Behavior is normal. Judgment and thought content normal.     BMET    Component Value Date/Time   NA 139 10/09/2018 1455   K 3.7 10/09/2018 1455   CL 103 10/09/2018 1455   CO2 28 10/09/2018 1455   GLUCOSE 91 10/09/2018 1455   BUN 12 10/09/2018 1455   CREATININE 0.57 10/09/2018 1455   CALCIUM 9.6 10/09/2018 1455   GFRNONAA >90 10/04/2014 1450   GFRAA >90 10/04/2014 1450    Lipid Panel     Component Value Date/Time   CHOL 185 10/09/2018 1455   TRIG 86.0 10/09/2018 1455  HDL 49.10 10/09/2018 1455   CHOLHDL 4 10/09/2018 1455   VLDL 17.2 10/09/2018 1455   LDLCALC 119 (H) 10/09/2018 1455    CBC    Component Value Date/Time   WBC 6.3 10/09/2018 1455   RBC 4.90 10/09/2018 1455   HGB 13.8 10/09/2018 1455   HCT 41.1 10/09/2018 1455   PLT 183.0 10/09/2018 1455   MCV 83.7 10/09/2018 1455   MCH 28.3 12/02/2015 0615   MCHC 33.5 10/09/2018 1455   RDW 13.4 10/09/2018 1455   LYMPHSABS 4.9 (H) 10/03/2014 1804   MONOABS 0.6 10/03/2014 1804   EOSABS 0.0 10/03/2014 1804   BASOSABS 0.3 (H) 10/03/2014 1804    Hgb A1C No results found for: HGBA1C           Assessment & Plan:    Preventative Health Maintneance:  Flu shot UTD Tetanus UTD Pap smear UTD Encouraged her to consume a balanced diet and exercise regimen Advised her to see a dentist annually Will check CBC, CMET and Lipid profile   RTC in 1 year, sooner if needed Nicki Reaper, NP This visit occurred during the SARS-CoV-2 public health emergency.  Safety protocols were in place, including screening questions prior to the visit, additional usage of staff PPE, and extensive cleaning of exam room while observing appropriate contact time as indicated for disinfecting solutions.

## 2019-10-12 NOTE — Patient Instructions (Signed)
Health Maintenance, Female Adopting a healthy lifestyle and getting preventive care are important in promoting health and wellness. Ask your health care provider about:  The right schedule for you to have regular tests and exams.  Things you can do on your own to prevent diseases and keep yourself healthy. What should I know about diet, weight, and exercise? Eat a healthy diet   Eat a diet that includes plenty of vegetables, fruits, low-fat dairy products, and lean protein.  Do not eat a lot of foods that are high in solid fats, added sugars, or sodium. Maintain a healthy weight Body mass index (BMI) is used to identify weight problems. It estimates body fat based on height and weight. Your health care provider can help determine your BMI and help you achieve or maintain a healthy weight. Get regular exercise Get regular exercise. This is one of the most important things you can do for your health. Most adults should:  Exercise for at least 150 minutes each week. The exercise should increase your heart rate and make you sweat (moderate-intensity exercise).  Do strengthening exercises at least twice a week. This is in addition to the moderate-intensity exercise.  Spend less time sitting. Even light physical activity can be beneficial. Watch cholesterol and blood lipids Have your blood tested for lipids and cholesterol at 33 years of age, then have this test every 5 years. Have your cholesterol levels checked more often if:  Your lipid or cholesterol levels are high.  You are older than 33 years of age.  You are at high risk for heart disease. What should I know about cancer screening? Depending on your health history and family history, you may need to have cancer screening at various ages. This may include screening for:  Breast cancer.  Cervical cancer.  Colorectal cancer.  Skin cancer.  Lung cancer. What should I know about heart disease, diabetes, and high blood  pressure? Blood pressure and heart disease  High blood pressure causes heart disease and increases the risk of stroke. This is more likely to develop in people who have high blood pressure readings, are of African descent, or are overweight.  Have your blood pressure checked: ? Every 3-5 years if you are 18-39 years of age. ? Every year if you are 40 years old or older. Diabetes Have regular diabetes screenings. This checks your fasting blood sugar level. Have the screening done:  Once every three years after age 40 if you are at a normal weight and have a low risk for diabetes.  More often and at a younger age if you are overweight or have a high risk for diabetes. What should I know about preventing infection? Hepatitis B If you have a higher risk for hepatitis B, you should be screened for this virus. Talk with your health care provider to find out if you are at risk for hepatitis B infection. Hepatitis C Testing is recommended for:  Everyone born from 1945 through 1965.  Anyone with known risk factors for hepatitis C. Sexually transmitted infections (STIs)  Get screened for STIs, including gonorrhea and chlamydia, if: ? You are sexually active and are younger than 33 years of age. ? You are older than 33 years of age and your health care provider tells you that you are at risk for this type of infection. ? Your sexual activity has changed since you were last screened, and you are at increased risk for chlamydia or gonorrhea. Ask your health care provider if   you are at risk.  Ask your health care provider about whether you are at high risk for HIV. Your health care provider may recommend a prescription medicine to help prevent HIV infection. If you choose to take medicine to prevent HIV, you should first get tested for HIV. You should then be tested every 3 months for as long as you are taking the medicine. Pregnancy  If you are about to stop having your period (premenopausal) and  you may become pregnant, seek counseling before you get pregnant.  Take 400 to 800 micrograms (mcg) of folic acid every day if you become pregnant.  Ask for birth control (contraception) if you want to prevent pregnancy. Osteoporosis and menopause Osteoporosis is a disease in which the bones lose minerals and strength with aging. This can result in bone fractures. If you are 65 years old or older, or if you are at risk for osteoporosis and fractures, ask your health care provider if you should:  Be screened for bone loss.  Take a calcium or vitamin D supplement to lower your risk of fractures.  Be given hormone replacement therapy (HRT) to treat symptoms of menopause. Follow these instructions at home: Lifestyle  Do not use any products that contain nicotine or tobacco, such as cigarettes, e-cigarettes, and chewing tobacco. If you need help quitting, ask your health care provider.  Do not use street drugs.  Do not share needles.  Ask your health care provider for help if you need support or information about quitting drugs. Alcohol use  Do not drink alcohol if: ? Your health care provider tells you not to drink. ? You are pregnant, may be pregnant, or are planning to become pregnant.  If you drink alcohol: ? Limit how much you use to 0-1 drink a day. ? Limit intake if you are breastfeeding.  Be aware of how much alcohol is in your drink. In the [place] one drink equals one 12 oz bottle of beer (355 mL), one 5 oz glass of wine (148 mL), or one 1 oz glass of hard liquor (44 mL). General instructions  Schedule regular health, dental, and eye exams.  Stay current with your vaccines.  Tell your health care provider if: ? You often feel depressed. ? You have ever been abused or do not feel safe at home. Summary  Adopting a healthy lifestyle and getting preventive care are important in promoting health and wellness.  Follow your health care provider's instructions about healthy  diet, exercising, and getting tested or screened for diseases.  Follow your health care provider's instructions on monitoring your cholesterol and blood pressure. This information is not intended to replace advice given to you by your health care provider. Make sure you discuss any questions you have with your health care provider. Document Revised: 08/16/2018 Document Reviewed: 08/16/2018 Elsevier Patient Education  2020 [employer]  

## 2019-10-13 LAB — COMPREHENSIVE METABOLIC PANEL
AG Ratio: 1.5 (calc) (ref 1.0–2.5)
ALT: 23 U/L (ref 6–29)
AST: 18 U/L (ref 10–30)
Albumin: 4.3 g/dL (ref 3.6–5.1)
Alkaline phosphatase (APISO): 64 U/L (ref 31–125)
BUN: 10 mg/dL (ref 7–25)
CO2: 26 mmol/L (ref 20–32)
Calcium: 9.9 mg/dL (ref 8.6–10.2)
Chloride: 103 mmol/L (ref 98–110)
Creat: 0.59 mg/dL (ref 0.50–1.10)
Globulin: 2.8 g/dL (calc) (ref 1.9–3.7)
Glucose, Bld: 86 mg/dL (ref 65–99)
Potassium: 3.9 mmol/L (ref 3.5–5.3)
Sodium: 139 mmol/L (ref 135–146)
Total Bilirubin: 1.3 mg/dL — ABNORMAL HIGH (ref 0.2–1.2)
Total Protein: 7.1 g/dL (ref 6.1–8.1)

## 2019-10-13 LAB — CBC
HCT: 38.1 % (ref 35.0–45.0)
Hemoglobin: 13.1 g/dL (ref 11.7–15.5)
MCH: 28.9 pg (ref 27.0–33.0)
MCHC: 34.4 g/dL (ref 32.0–36.0)
MCV: 83.9 fL (ref 80.0–100.0)
MPV: 11.2 fL (ref 7.5–12.5)
Platelets: 184 10*3/uL (ref 140–400)
RBC: 4.54 10*6/uL (ref 3.80–5.10)
RDW: 12.3 % (ref 11.0–15.0)
WBC: 7.5 10*3/uL (ref 3.8–10.8)

## 2019-10-13 LAB — LIPID PANEL
Cholesterol: 187 mg/dL (ref <200)
HDL: 44 mg/dL — ABNORMAL LOW (ref >=50)
LDL Cholesterol (Calc): 123 mg/dL (calc) — ABNORMAL HIGH
Non-HDL Cholesterol (Calc): 143 mg/dL (calc) — ABNORMAL HIGH (ref <130)
Total CHOL/HDL Ratio: 4.3 (calc) (ref <5.0)
Triglycerides: 96 mg/dL (ref <150)

## 2019-11-04 ENCOUNTER — Other Ambulatory Visit: Payer: Self-pay | Admitting: Internal Medicine

## 2020-02-06 ENCOUNTER — Telehealth: Payer: Self-pay | Admitting: *Deleted

## 2020-02-06 NOTE — Telephone Encounter (Signed)
Noted. Agree with advice given.

## 2020-02-06 NOTE — Telephone Encounter (Signed)
Patient called stating that she scheduled an appointment thru my chart for chest tightness and was advised to call and talk with triage. Patient stated that she is not having chest pain.  Patient stated that she does have chest tightness after she eats and it is worse at night when she lays down. Patient stated that she had indigestion when she was pregnant and this feels like that. 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  Patient stated that her diet is not very good, but she is going to work on that. Patient stated that she is scheduled for 02/19/20 and feels that is fine. Patient stated that she can not come in earlier because she is going out of town. Patient was given ER precautions and she verbalized understanding.

## 2020-02-19 ENCOUNTER — Ambulatory Visit: Payer: BC Managed Care – PPO | Admitting: Internal Medicine

## 2020-02-19 ENCOUNTER — Other Ambulatory Visit: Payer: Self-pay

## 2020-02-19 ENCOUNTER — Encounter: Payer: Self-pay | Admitting: Internal Medicine

## 2020-02-19 VITALS — BP 116/78 | HR 72 | Temp 98.5°F | Wt 161.0 lb

## 2020-02-19 DIAGNOSIS — K219 Gastro-esophageal reflux disease without esophagitis: Secondary | ICD-10-CM | POA: Diagnosis not present

## 2020-02-19 DIAGNOSIS — F5104 Psychophysiologic insomnia: Secondary | ICD-10-CM

## 2020-02-19 DIAGNOSIS — K5900 Constipation, unspecified: Secondary | ICD-10-CM | POA: Diagnosis not present

## 2020-02-19 DIAGNOSIS — F419 Anxiety disorder, unspecified: Secondary | ICD-10-CM | POA: Diagnosis not present

## 2020-02-19 LAB — COMPREHENSIVE METABOLIC PANEL
ALT: 22 U/L (ref 0–35)
AST: 16 U/L (ref 0–37)
Albumin: 4.6 g/dL (ref 3.5–5.2)
Alkaline Phosphatase: 66 U/L (ref 39–117)
BUN: 13 mg/dL (ref 6–23)
CO2: 27 mEq/L (ref 19–32)
Calcium: 9.7 mg/dL (ref 8.4–10.5)
Chloride: 103 mEq/L (ref 96–112)
Creatinine, Ser: 0.57 mg/dL (ref 0.40–1.20)
GFR: 122.13 mL/min (ref >60.00)
Glucose, Bld: 93 mg/dL (ref 70–99)
Potassium: 3.9 mEq/L (ref 3.5–5.1)
Sodium: 136 mEq/L (ref 135–145)
Total Bilirubin: 1.3 mg/dL — ABNORMAL HIGH (ref 0.2–1.2)
Total Protein: 7.6 g/dL (ref 6.0–8.3)

## 2020-02-19 LAB — CBC
HCT: 40.1 % (ref 36.0–46.0)
Hemoglobin: 13.6 g/dL (ref 12.0–15.0)
MCHC: 34 g/dL (ref 30.0–36.0)
MCV: 83.5 fl (ref 78.0–100.0)
Platelets: 197 10*3/uL (ref 150.0–400.0)
RBC: 4.81 Mil/uL (ref 3.87–5.11)
RDW: 13.3 % (ref 11.5–15.5)
WBC: 7.2 10*3/uL (ref 4.0–10.5)

## 2020-02-19 LAB — H. PYLORI ANTIBODY, IGG: H Pylori IgG: NEGATIVE

## 2020-02-19 LAB — AMYLASE: Amylase: 29 U/L (ref 27–131)

## 2020-02-19 LAB — LIPASE: Lipase: 23 U/L (ref 11.0–59.0)

## 2020-02-19 MED ORDER — OMEPRAZOLE 20 MG PO CPDR: 20.0000 mg | DELAYED_RELEASE_CAPSULE | Freq: Every day | ORAL | 0 refills | Status: DC

## 2020-02-19 NOTE — Patient Instructions (Signed)

## 2020-02-19 NOTE — Progress Notes (Signed)
Subjective:    Patient ID: Brittany [name], female    DOB: 05/21/1986, 34 y.o.   MRN: [id number]  HPI  Pt presents to the clinic today with c/o reflux. She reports this started 2-3 months ago. She reports burning sensation in her chest and intermittent constipation but denies sore throat, hoarseness, nausea, vomiting, diarrhea, or blood in her stool. The pain is worse after eating. She denies chest pain, chest tightness or SOB. She denies fever, chills or body aches. She has tried Tums OTC with some relief of symptoms. She has tried to stop eating red meat but denies medications. She has had some increased stress and weight gain during Covid.  She also reports anxiety. She noticed this about 2-3 month ago. 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 She has been taking Trazadone as needed but reports this has not really been effective with her anxiety or her sleep.  Review of Systems      Past Medical History:  Diagnosis Date  . Carcinoma in situ of uterine cervix    seen by Dr Andrey Farmer of gyn onc.  rec. conization after delivery    Current Outpatient Medications  Medication Sig Dispense Refill  . levonorgestrel (MIRENA, 52 MG,) 20 MCG/24HR IUD Inserted 2017    . traZODone (DESYREL) 50 MG tablet TAKE 0.5-1 TABLETS (25-50 MG TOTAL) BY MOUTH AT BEDTIME AS NEEDED FOR SLEEP. 90 tablet 1   No current facility-administered medications for this visit.    No Known Allergies  Family History  Problem Relation Age of Onset  . Diabetes Mother   . Heart failure Mother   . Alcohol abuse Maternal Grandmother   . Arthritis Neg Hx   . Asthma Neg Hx   . Birth defects Neg Hx   . Cancer Neg Hx   . COPD Neg Hx   . Depression Neg Hx   . Drug abuse Neg Hx   . Early death Neg Hx   . Hearing loss Neg Hx   . Heart disease Neg Hx   . Hyperlipidemia Neg Hx   . Hypertension Neg Hx   . Kidney disease Neg Hx   . Learning  disabilities Neg Hx   . Mental illness Neg Hx   . Mental retardation Neg Hx   . Miscarriages / Stillbirths Neg Hx   . Stroke Neg Hx   . Vision loss Neg Hx   . Varicose Veins Neg Hx     Social History   Socioeconomic History  . Marital status: Married    Spouse name: Not on file  . Number of children: Not on file  . Years of education: Not on file  . Highest education level: Not on file  Occupational History  . Not on file  Tobacco Use  . Smoking status: Never Smoker  . Smokeless tobacco: Never Used  Substance and Sexual Activity  . Alcohol use: No  . Drug use: No  . Sexual activity: Yes    Birth control/protection: Condom  Other Topics Concern  . Not on file  Social History Narrative  . Not on file   Social Determinants of Health   Financial Resource Strain:   . Difficulty of Paying Living Expenses:   Food Insecurity:   . Worried About Programme researcher, broadcasting/film/video in the Last Year:   . Barista in the Last Year:   Transportation Needs:   .  Lack of Transportation (Medical):   Marland Kitchen Lack of Transportation (Non-Medical):   Physical Activity:   . Days of Exercise per Week:   . Minutes of Exercise per Session:   Stress:   . Feeling of Stress :   Social Connections:   . Frequency of Communication with Friends and Family:   . Frequency of Social Gatherings with Friends and Family:   . Attends Religious Services:   . Active Member of Clubs or Organizations:   . Attends Banker Meetings:   Marland Kitchen Marital Status:   Intimate Partner Violence:   . Fear of Current or Ex-Partner:   . Emotionally Abused:   Marland Kitchen Physically Abused:   . Sexually Abused:      Constitutional: Denies fever, malaise, fatigue, headache or abrupt weight changes.  Respiratory: Denies difficulty breathing, shortness of breath, cough or sputum production.   Cardiovascular: Denies chest pain, chest tightness, palpitations or swelling in the hands or feet.  Gastrointestinal: Pt reports reflux,  constipation. Denies abdominal pain, bloating, diarrhea or blood in the stool.  GU: Denies urgency, frequency, pain with urination, burning sensation, blood in urine, odor or discharge. Psych: Pt reports anxiety. Denies depression, SI/HI.  No other specific complaints in a complete review of systems (except as listed in HPI above).  Objective:   Physical Exam  BP 116/78   Pulse 72   Temp 98.5 F (36.9 C) (Temporal)   Wt 161 lb (73 kg)   SpO2 98%   BMI 27.00 kg/m   Wt Readings from Last 3 Encounters:  10/12/19 157 lb (71.2 kg)  02/02/19 155 lb (70.3 kg)  10/09/18 140 lb (63.5 kg)    General: Appears her stated age, well developed, well nourished in NAD. HEENT: Head: normal shape and size, no bald spots noted;  Cardiovascular: Normal rate and rhythm. S1,S2 noted.  No murmur, rubs or gallops noted.  Pulmonary/Chest: Normal effort and positive vesicular breath sounds. No respiratory distress. No wheezes, rales or ronchi noted.  Abdomen: Soft and nontender. Normal bowel sounds. No distention or masses noted. Liver, spleen and kidneys non palpable. Neurological: Alert and oriented.   Psychiatric: Mood and affect normal. Behavior is normal. Judgment and thought content normal.     BMET    Component Value Date/Time   NA 139 10/12/2019 1456   K 3.9 10/12/2019 1456   CL 103 10/12/2019 1456   CO2 26 10/12/2019 1456   GLUCOSE 86 10/12/2019 1456   BUN 10 10/12/2019 1456   CREATININE 0.59 10/12/2019 1456   CALCIUM 9.9 10/12/2019 1456   GFRNONAA >90 10/04/2014 1450   GFRAA >90 10/04/2014 1450    Lipid Panel     Component Value Date/Time   CHOL 187 10/12/2019 1456   TRIG 96 10/12/2019 1456   HDL 44 (L) 10/12/2019 1456   CHOLHDL 4.3 10/12/2019 1456   VLDL 17.2 10/09/2018 1455   LDLCALC 123 (H) 10/12/2019 1456    CBC    Component Value Date/Time   WBC 7.5 10/12/2019 1456   RBC 4.54 10/12/2019 1456   HGB 13.1 10/12/2019 1456   HCT 38.1 10/12/2019 1456   PLT 184  10/12/2019 1456   MCV 83.9 10/12/2019 1456   MCH 28.9 10/12/2019 1456   MCHC 34.4 10/12/2019 1456   RDW 12.3 10/12/2019 1456   LYMPHSABS 4.9 (H) 10/03/2014 1804   MONOABS 0.6 10/03/2014 1804   EOSABS 0.0 10/03/2014 1804   BASOSABS 0.3 (H) 10/03/2014 1804    Hgb A1C No results found  for: HGBA1C          Assessment & Plan:   GERD, Constipation:  Will check CBC, CMET, Amylase, Lipase and H Pylori RX for Omeprazole 20 mg PO daily x 2 weeks  Anxiety, Insomnia:  Increase Trazadone to 1.5-2 tabs PO QHS prn If sleeping better does not improve anxiety, we can discuss alternative treatments  Update me in 2 weeks via mychart, return precautions discussed Webb Silversmith, NP This visit occurred during the SARS-CoV-2 public health emergency.  Safety protocols were in place, including screening questions prior to the visit, additional usage of staff PPE, and extensive cleaning of exam room while observing appropriate contact time as indicated for disinfecting solutions.

## 2020-03-03 ENCOUNTER — Encounter: Payer: Self-pay | Admitting: Internal Medicine

## 2020-03-12 ENCOUNTER — Other Ambulatory Visit: Payer: Self-pay | Admitting: Internal Medicine

## 2020-03-12 DIAGNOSIS — K5900 Constipation, unspecified: Secondary | ICD-10-CM

## 2020-03-12 DIAGNOSIS — K219 Gastro-esophageal reflux disease without esophagitis: Secondary | ICD-10-CM

## 2020-04-10 ENCOUNTER — Other Ambulatory Visit: Payer: Self-pay | Admitting: Internal Medicine

## 2020-04-10 DIAGNOSIS — K219 Gastro-esophageal reflux disease without esophagitis: Secondary | ICD-10-CM

## 2020-04-10 DIAGNOSIS — K5900 Constipation, unspecified: Secondary | ICD-10-CM

## 2020-04-18 ENCOUNTER — Ambulatory Visit: Payer: Self-pay

## 2020-04-28 ENCOUNTER — Encounter: Payer: Self-pay | Admitting: Internal Medicine

## 2020-04-28 ENCOUNTER — Ambulatory Visit: Payer: BC Managed Care – PPO | Admitting: Internal Medicine

## 2020-04-28 ENCOUNTER — Other Ambulatory Visit: Payer: Self-pay

## 2020-04-28 VITALS — BP 114/72 | HR 76 | Temp 98.8°F | Wt 164.0 lb

## 2020-04-28 DIAGNOSIS — R202 Paresthesia of skin: Secondary | ICD-10-CM

## 2020-04-28 NOTE — Patient Instructions (Signed)
Paresthesia Paresthesia is a burning or prickling feeling. This feeling can happen in any part of the body. It often happens in the hands, arms, legs, or feet. Usually, it is not painful. In most cases, the feeling goes away in a short time and is not a sign of a serious problem. If you have paresthesia that lasts a long time, you may need to be seen by your doctor. Follow these instructions at home: Alcohol use   Do not drink alcohol if: ? Your doctor tells you not to drink. ? You are pregnant, may be pregnant, or are planning to become pregnant.  If you drink alcohol: ? Limit how much you use to:  0-1 drink a day for women.  0-2 drinks a day for men. ? Be aware of how much alcohol is in your drink. In the [place] one drink equals one 12 oz bottle of beer (355 mL), one 5 oz glass of wine (148 mL), or one 1 oz glass of hard liquor (44 mL). Nutrition   Eat a healthy diet. This includes: ? Eating foods that have a lot of fiber in them, such as fresh fruits and vegetables, whole grains, and beans. ? Limiting foods that have a lot of fat and processed sugars in them, such as fried or sweet foods. General instructions  Take over-the-counter and prescription medicines only as told by your doctor.  Do not use any products that have nicotine or tobacco in them, such as cigarettes and e-cigarettes. If you need help quitting, ask your doctor.  If you have diabetes, work with your doctor to make sure your blood sugar stays in a healthy range.  If your feet feel numb: ? Check for redness, warmth, and swelling every day. ? Wear padded socks and comfortable shoes. These help protect your feet.  Keep all follow-up visits as told by your doctor. This is important. Contact a doctor if:  You have paresthesia that gets worse or does not go away.  Your burning or prickling feeling gets worse when you walk.  You have pain or cramps.  You feel dizzy.  You have a rash. Get help right away if  you:  Feel weak.  Have trouble walking or moving.  Have problems speaking, understanding, or seeing.  Feel confused.  Cannot control when you pee (urinate) or poop (have a bowel movement).  Lose feeling (have numbness) after an injury.  Have new weakness in an arm or leg.  Pass out (faint). Summary  Paresthesia is a burning or prickling feeling. It often happens in the hands, arms, legs, or feet.  In most cases, the feeling goes away in a short time and is not a sign of a serious problem.  If you have paresthesia that lasts a long time, you may need to be seen by your doctor. This information is not intended to replace advice given to you by your health care provider. Make sure you discuss any questions you have with your health care provider. Document Revised: 09/18/2018 Document Reviewed: 09/01/2017 Elsevier Patient Education  2020 Elsevier Inc.  

## 2020-04-28 NOTE — Progress Notes (Signed)
Subjective:    Patient ID: Brittany [name], female    DOB: 01-Jun-1986, 34 y.o.   MRN: [id number]  HPI  Patient presents the clinic today for ER follow-up.  She went to the ER 8/13 by EMS with complaint of tingling from her left elbow into her left hand.  ECG was reassuring.  She was diagnosed with ulnar neuropathy, advised to take anti-inflammatories OTC and follow-up with her PCP.  Since discharge, she reports the tingling in her left arm has resolved, but reports she is having intermittent tingling in her feet. This only occurs at night. She reports the tingling improves when she moves her legs. She denies weakness or back pain. She has a family history of DM2.  Review of Systems      Past Medical History:  Diagnosis Date  . Carcinoma in situ of uterine cervix    seen by Dr Andrey Farmer of gyn onc.  rec. conization after delivery    Current Outpatient Medications  Medication Sig Dispense Refill  . levonorgestrel (MIRENA, 52 MG,) 20 MCG/24HR IUD Inserted 2017    . omeprazole (PRILOSEC) 20 MG capsule TAKE 1 CAPSULE BY MOUTH EVERY DAY 30 capsule 0  . traZODone (DESYREL) 50 MG tablet TAKE 0.5-1 TABLETS (25-50 MG TOTAL) BY MOUTH AT BEDTIME AS NEEDED FOR SLEEP. 90 tablet 1   No current facility-administered medications for this visit.    No Known Allergies  Family History  Problem Relation Age of Onset  . Diabetes Mother   . Heart failure Mother   . Alcohol abuse Maternal Grandmother   . Arthritis Neg Hx   . Asthma Neg Hx   . Birth defects Neg Hx   . Cancer Neg Hx   . COPD Neg Hx   . Depression Neg Hx   . Drug abuse Neg Hx   . Early death Neg Hx   . Hearing loss Neg Hx   . Heart disease Neg Hx   . Hyperlipidemia Neg Hx   . Hypertension Neg Hx   . Kidney disease Neg Hx   . Learning disabilities Neg Hx   . Mental illness Neg Hx   . Mental retardation Neg Hx   . Miscarriages / Stillbirths Neg Hx   . Stroke Neg Hx   . Vision loss Neg Hx   . Varicose Veins Neg Hx     Social  History   Socioeconomic History  . Marital status: Married    Spouse name: Not on file  . Number of children: Not on file  . Years of education: Not on file  . Highest education level: Not on file  Occupational History  . Not on file  Tobacco Use  . Smoking status: Never Smoker  . Smokeless tobacco: Never Used  Substance and Sexual Activity  . Alcohol use: No  . Drug use: No  . Sexual activity: Yes    Birth control/protection: Condom  Other Topics Concern  . Not on file  Social History Narrative  . Not on file   Social Determinants of Health   Financial Resource Strain:   . Difficulty of Paying Living Expenses: Not on file  Food Insecurity:   . Worried About Programme researcher, broadcasting/film/video in the Last Year: Not on file  . Ran Out of Food in the Last Year: Not on file  Transportation Needs:   . Lack of Transportation (Medical): Not on file  . Lack of Transportation (Non-Medical): Not on file  Physical Activity:   .  Days of Exercise per Week: Not on file  . Minutes of Exercise per Session: Not on file  Stress:   . Feeling of Stress : Not on file  Social Connections:   . Frequency of Communication with Friends and Family: Not on file  . Frequency of Social Gatherings with Friends and Family: Not on file  . Attends Religious Services: Not on file  . Active Member of Clubs or Organizations: Not on file  . Attends Banker Meetings: Not on file  . Marital Status: Not on file  Intimate Partner Violence:   . Fear of Current or Ex-Partner: Not on file  . Emotionally Abused: Not on file  . Physically Abused: Not on file  . Sexually Abused: Not on file     Constitutional: Denies fever, malaise, fatigue, headache or abrupt weight changes.  Respiratory: Denies difficulty breathing, shortness of breath, cough or sputum production.   Cardiovascular: Denies chest pain, chest tightness, palpitations or swelling in the hands or feet.  Musculoskeletal: Denies decrease in range of  motion, difficulty with gait, muscle pain or joint pain and swelling.  Skin: Denies redness, rashes, lesions or ulcercations.  Neurological: Pt reports tingling in left arm (resolved) and bilateral feet. Denies dizziness, difficulty with memory, difficulty with speech or problems with balance and coordination.    No other specific complaints in a complete review of systems (except as listed in HPI above).  Objective:   Physical Exam  BP 114/72   Pulse 76   Temp 98.8 F (37.1 C) (Temporal)   Wt 164 lb (74.4 kg)   SpO2 98%   BMI 27.50 kg/m   Wt Readings from Last 3 Encounters:  02/19/20 161 lb (73 kg)  10/12/19 157 lb (71.2 kg)  02/02/19 155 lb (70.3 kg)    General: Appears her stated age, well developed, well nourished in NAD. Skin: Warm, dry and intact. No rashes noted. Cardiovascular: Normal rate and rhythm. S1,S2 noted.  No murmur, rubs or gallops noted.  Pulmonary/Chest: Normal effort and positive vesicular breath sounds. No respiratory distress. No wheezes, rales or ronchi noted. Radial and pedal pulses 2+ bilaterally. Musculoskeletal: Normal flexion, extension and rotation of the spine. No bony tenderness noted over the spine. Strength 5/5 BUE/BLE. Neurological: Alert and oriented. Negative Tinel's. Negative Phalen's.   BMET    Component Value Date/Time   NA 136 02/19/2020 1141   K 3.9 02/19/2020 1141   CL 103 02/19/2020 1141   CO2 27 02/19/2020 1141   GLUCOSE 93 02/19/2020 1141   BUN 13 02/19/2020 1141   CREATININE 0.57 02/19/2020 1141   CREATININE 0.59 10/12/2019 1456   CALCIUM 9.7 02/19/2020 1141   GFRNONAA >90 10/04/2014 1450   GFRAA >90 10/04/2014 1450    Lipid Panel     Component Value Date/Time   CHOL 187 10/12/2019 1456   TRIG 96 10/12/2019 1456   HDL 44 (L) 10/12/2019 1456   CHOLHDL 4.3 10/12/2019 1456   VLDL 17.2 10/09/2018 1455   LDLCALC 123 (H) 10/12/2019 1456    CBC    Component Value Date/Time   WBC 7.2 02/19/2020 1141   RBC 4.81  02/19/2020 1141   HGB 13.6 02/19/2020 1141   HCT 40.1 02/19/2020 1141   PLT 197.0 02/19/2020 1141   MCV 83.5 02/19/2020 1141   MCH 28.9 10/12/2019 1456   MCHC 34.0 02/19/2020 1141   RDW 13.3 02/19/2020 1141   LYMPHSABS 4.9 (H) 10/03/2014 1804   MONOABS 0.6 10/03/2014 1804  EOSABS 0.0 10/03/2014 1804   BASOSABS 0.3 (H) 10/03/2014 1804    Hgb A1C No results found for: HGBA1C        Assessment & Plan:   ER follow-up for Ulnar Neuropathy:  ER notes and procedures reviewed Full ROM, no indication for x-ray at this time We will check TSH, A1C,, vitamin D and B12 today Consider referral to neurology for nerve conduction study  We will follow-up after labs, return precautions discussed  Nicki Reaper, NP This visit occurred during the SARS-CoV-2 public health emergency.  Safety protocols were in place, including screening questions prior to the visit, additional usage of staff PPE, and extensive cleaning of exam room while observing appropriate contact time as indicated for disinfecting solutions.

## 2020-04-29 LAB — TSH: TSH: 1.51 u[IU]/mL (ref 0.35–4.50)

## 2020-04-29 LAB — VITAMIN D 25 HYDROXY (VIT D DEFICIENCY, FRACTURES): VITD: 16.78 ng/mL — ABNORMAL LOW (ref 30.00–100.00)

## 2020-04-29 LAB — VITAMIN B12: Vitamin B-12: 180 pg/mL — ABNORMAL LOW (ref 211–911)

## 2020-04-29 LAB — HEMOGLOBIN A1C: Hgb A1c MFr Bld: 5.1 % (ref 4.6–6.5)

## 2020-05-01 ENCOUNTER — Encounter: Payer: Self-pay | Admitting: Internal Medicine

## 2020-05-01 DIAGNOSIS — R202 Paresthesia of skin: Secondary | ICD-10-CM

## 2020-05-01 DIAGNOSIS — E559 Vitamin D deficiency, unspecified: Secondary | ICD-10-CM

## 2020-05-01 DIAGNOSIS — E538 Deficiency of other specified B group vitamins: Secondary | ICD-10-CM

## 2020-05-01 MED ORDER — VITAMIN D (ERGOCALCIFEROL) 1.25 MG (50000 UNIT) PO CAPS: 50000.0000 [IU] | ORAL_CAPSULE | ORAL | 0 refills | Status: DC

## 2020-05-11 ENCOUNTER — Other Ambulatory Visit: Payer: Self-pay | Admitting: Internal Medicine

## 2020-07-16 ENCOUNTER — Ambulatory Visit (INDEPENDENT_AMBULATORY_CARE_PROVIDER_SITE_OTHER): Payer: BC Managed Care – PPO | Admitting: Advanced Practice Midwife

## 2020-07-16 ENCOUNTER — Encounter: Payer: Self-pay | Admitting: Advanced Practice Midwife

## 2020-07-16 ENCOUNTER — Other Ambulatory Visit (HOSPITAL_COMMUNITY)
Admission: RE | Admit: 2020-07-16 | Discharge: 2020-07-16 | Disposition: A | Discharge status: Home / Self Care | Payer: BC Managed Care – PPO | Source: Ambulatory Visit | Attending: Advanced Practice Midwife | Admitting: Advanced Practice Midwife

## 2020-07-16 ENCOUNTER — Other Ambulatory Visit: Payer: Self-pay | Admitting: Internal Medicine

## 2020-07-16 ENCOUNTER — Other Ambulatory Visit: Payer: Self-pay

## 2020-07-16 VITALS — BP 130/80 | HR 86 | Ht 65.0 in | Wt 163.4 lb

## 2020-07-16 DIAGNOSIS — N898 Other specified noninflammatory disorders of vagina: Secondary | ICD-10-CM | POA: Diagnosis not present

## 2020-07-16 DIAGNOSIS — Z01419 Encounter for gynecological examination (general) (routine) without abnormal findings: Secondary | ICD-10-CM | POA: Insufficient documentation

## 2020-07-16 DIAGNOSIS — Z8742 Personal history of other diseases of the female genital tract: Secondary | ICD-10-CM

## 2020-07-16 DIAGNOSIS — E559 Vitamin D deficiency, unspecified: Secondary | ICD-10-CM

## 2020-07-16 DIAGNOSIS — T8332XA Displacement of intrauterine contraceptive device, initial encounter: Secondary | ICD-10-CM

## 2020-07-16 DIAGNOSIS — R202 Paresthesia of skin: Secondary | ICD-10-CM

## 2020-07-16 MED ORDER — FLUCONAZOLE 150 MG PO TABS: 150.0000 mg | ORAL_TABLET | Freq: Once | ORAL | 0 refills | Status: AC

## 2020-07-16 NOTE — Patient Instructions (Signed)
Preventive Care 21-33 Years Old, Female Preventive care refers to visits with your health care provider and lifestyle choices that can promote health and wellness. This includes:  A yearly physical exam. This may also be called an annual well check.  Regular dental visits and eye exams.  Immunizations.  Screening for certain conditions.  Healthy lifestyle choices, such as eating a healthy diet, getting regular exercise, not using drugs or products that contain nicotine and tobacco, and limiting alcohol use. What can I expect for my preventive care visit? Physical exam Your health care provider will check your:  Height and weight. This may be used to calculate body mass index (BMI), which tells if you are at a healthy weight.  Heart rate and blood pressure.  Skin for abnormal spots. Counseling Your health care provider may ask you questions about your:  Alcohol, tobacco, and drug use.  Emotional well-being.  Home and relationship well-being.  Sexual activity.  Eating habits.  Work and work environment.  Method of birth control.  Menstrual cycle.  Pregnancy history. What immunizations do I need?  Influenza (flu) vaccine  This is recommended every year. Tetanus, diphtheria, and pertussis (Tdap) vaccine  You may need a Td booster every 10 years. Varicella (chickenpox) vaccine  You may need this if you have not been vaccinated. Human papillomavirus (HPV) vaccine  If recommended by your health care provider, you may need three doses over 6 months. Measles, mumps, and rubella (MMR) vaccine  You may need at least one dose of MMR. You may also need a second dose. Meningococcal conjugate (MenACWY) vaccine  One dose is recommended if you are age 19-21 years and a first-year college student living in a residence hall, or if you have one of several medical conditions. You may also need additional booster doses. Pneumococcal conjugate (PCV13) vaccine  You may need  this if you have certain conditions and were not previously vaccinated. Pneumococcal polysaccharide (PPSV23) vaccine  You may need one or two doses if you smoke cigarettes or if you have certain conditions. Hepatitis A vaccine  You may need this if you have certain conditions or if you travel or work in places where you may be exposed to hepatitis A. Hepatitis B vaccine  You may need this if you have certain conditions or if you travel or work in places where you may be exposed to hepatitis B. Haemophilus influenzae type b (Hib) vaccine  You may need this if you have certain conditions. You may receive vaccines as individual doses or as more than one vaccine together in one shot (combination vaccines). Talk with your health care provider about the risks and benefits of combination vaccines. What tests do I need?  Blood tests  Lipid and cholesterol levels. These may be checked every 5 years starting at age 20.  Hepatitis C test.  Hepatitis B test. Screening  Diabetes screening. This is done by checking your blood sugar (glucose) after you have not eaten for a while (fasting).  Sexually transmitted disease (STD) testing.  BRCA-related cancer screening. This may be done if you have a family history of breast, ovarian, tubal, or peritoneal cancers.  Pelvic exam and Pap test. This may be done every 3 years starting at age 21. Starting at age 30, this may be done every 5 years if you have a Pap test in combination with an HPV test. Talk with your health care provider about your test results, treatment options, and if necessary, the need for more tests.   Follow these instructions at home: Eating and drinking   Eat a diet that includes fresh fruits and vegetables, whole grains, lean protein, and low-fat dairy.  Take vitamin and mineral supplements as recommended by your health care provider.  Do not drink alcohol if: ? Your health care provider tells you not to drink. ? You are  pregnant, may be pregnant, or are planning to become pregnant.  If you drink alcohol: ? Limit how much you have to 0-1 drink a day. ? Be aware of how much alcohol is in your drink. In the [place] one drink equals one 12 oz bottle of beer (355 mL), one 5 oz glass of wine (148 mL), or one 1 oz glass of hard liquor (44 mL). Lifestyle  Take daily care of your teeth and gums.  Stay active. Exercise for at least 30 minutes on 5 or more days each week.  Do not use any products that contain nicotine or tobacco, such as cigarettes, e-cigarettes, and chewing tobacco. If you need help quitting, ask your health care provider.  If you are sexually active, practice safe sex. Use a condom or other form of birth control (contraception) in order to prevent pregnancy and STIs (sexually transmitted infections). If you plan to become pregnant, see your health care provider for a preconception visit. What's next?  Visit your health care provider once a year for a well check visit.  Ask your health care provider how often you should have your eyes and teeth checked.  Stay up to date on all vaccines. This information is not intended to replace advice given to you by your health care provider. Make sure you discuss any questions you have with your health care provider. Document Revised: 05/04/2018 Document Reviewed: 05/04/2018 Elsevier Patient Education  2020 [employer].

## 2020-07-16 NOTE — Progress Notes (Addendum)
GYNECOLOGY ANNUAL PREVENTATIVE CARE ENCOUNTER NOTE  History:     Brittany [name] is a 34 y.o. [id number] female here for a routine annual gynecologic exam.  Current complaints: none, would like to establish care prior to having her Mirena removed in April 2022.   Denies abnormal vaginal bleeding, discharge, pelvic pain, problems with intercourse or other gynecologic concerns.    Gynecologic History No LMP recorded. (Menstrual status: IUD). Contraception: IUD Last Pap: 10/09/2018. Results were: normal with negative HPV Last mammogram: N/A  Obstetric History OB History  Gravida Para Term Preterm AB Living  2 2 2     2   SAB TAB Ectopic Multiple Live Births        0 2    # Outcome Date GA Lbr Len/2nd Weight Sex Delivery Anes PTL Lv  2 Term 10/03/15 [redacted]w[redacted]d 09:19 / 00:17 6 lb 0.6 oz (2.739 kg) M Vag-Spont EPI  LIV  1 Term 2011 [redacted]w[redacted]d  6 lb 15 oz (3.147 kg) M Vag-Spont EPI  LIV    Past Medical History:  Diagnosis Date  . Carcinoma in situ of uterine cervix    seen by Dr [redacted]w[redacted]d of gyn onc.  rec. conization after delivery    Past Surgical History:  Procedure Laterality Date  . CERVICAL CONIZATION W/BX N/A 12/02/2015   Procedure: CONIZATION CERVIX WITH BIOPSY;  Surgeon: 12/04/2015, MD;  Location: [facility];  Service: Gynecology;  Laterality: N/A;  . NO PAST SURGERIES      Current Outpatient Medications on File Prior to Visit  Medication Sig Dispense Refill  . Cyanocobalamin (VITAMIN B 12) 500 MCG TABS Take by mouth in the morning and at bedtime.    . traZODone (DESYREL) 50 MG tablet TAKE 0.5-1 TABLETS (25-50 MG TOTAL) BY MOUTH AT BEDTIME AS NEEDED FOR SLEEP. 90 tablet 0  . Vitamin D, Ergocalciferol, (DRISDOL) 1.25 MG (50000 UNIT) CAPS capsule Take 1 capsule (50,000 Units total) by mouth every 7 (seven) days. 12 capsule 0  . levonorgestrel (MIRENA, 52 MG,) 20 MCG/24HR IUD Inserted 2017    . omeprazole (PRILOSEC) 20 MG capsule TAKE 1 CAPSULE BY MOUTH EVERY DAY (Patient not taking:  Reported on 07/16/2020) 30 capsule 0   No current facility-administered medications on file prior to visit.    No Known Allergies  Social History:  reports that she has never smoked. She has never used smokeless tobacco. She reports that she does not drink alcohol and does not use drugs.  Family History  Problem Relation Age of Onset  . Diabetes Mother   . Heart failure Mother   . Alcohol abuse Maternal Grandmother   . Arthritis Neg Hx   . Asthma Neg Hx   . Birth defects Neg Hx   . Cancer Neg Hx   . COPD Neg Hx   . Depression Neg Hx   . Drug abuse Neg Hx   . Early death Neg Hx   . Hearing loss Neg Hx   . Heart disease Neg Hx   . Hyperlipidemia Neg Hx   . Hypertension Neg Hx   . Kidney disease Neg Hx   . Learning disabilities Neg Hx   . Mental illness Neg Hx   . Mental retardation Neg Hx   . Miscarriages / Stillbirths Neg Hx   . Stroke Neg Hx   . Vision loss Neg Hx   . Varicose Veins Neg Hx     The following portions of the patient's history were reviewed and updated as  appropriate: allergies, current medications, past family history, past medical history, past social history, past surgical history and problem list.  Review of Systems Pertinent items noted in HPI and remainder of comprehensive ROS otherwise negative.  Physical Exam:  BP 130/80   Pulse 86   Ht 5\' 5"  (1.651 m)   Wt 163 lb 6.4 oz (74.1 kg)   BMI 27.19 kg/m  CONSTITUTIONAL: Well-developed, well-nourished female in no acute distress.  HENT:  Normocephalic, atraumatic, External right and left ear normal. Oropharynx is clear and moist EYES: Conjunctivae and EOM are normal. Pupils are equal, round, and reactive to light. No scleral icterus.  NECK: Normal range of motion, supple, no masses.  Normal thyroid.  SKIN: Skin is warm and dry. No rash noted. Not diaphoretic. No erythema. No pallor. MUSCULOSKELETAL: Normal range of motion. No tenderness.  No cyanosis, clubbing, or edema.  2+ distal  pulses. NEUROLOGIC: Alert and oriented to person, place, and time. Normal reflexes, muscle tone coordination.  PSYCHIATRIC: Normal mood and affect. Normal behavior. Normal judgment and thought content. CARDIOVASCULAR: Normal heart rate noted, regular rhythm RESPIRATORY: Clear to auscultation bilaterally. Effort and breath sounds normal, no problems with respiration noted. BREASTS: Symmetric in size. No masses, tenderness, skin changes, nipple drainage, or lymphadenopathy bilaterally. Performed in the presence of a chaperone. ABDOMEN: Soft, no distention noted.  No tenderness, rebound or guarding.  PELVIC: Normal appearing external genitalia and urethral meatus; normal appearing vaginal mucosa and cervix.  Thick white discharge consistent with yeast. Pap smear obtained. IUD strings not visible, unable to tease out from os.  Performed in the presence of a chaperone.   Assessment and Plan:   1. Well woman exam with routine gynecological exam  2. Intrauterine contraceptive device threads lost, initial encounter - Strings not visualized - Patient amenorrheic and asymptomatic, potentially short strings but appropriately placed IUD - PELVIC COMPLETE WITH TRANSVAGINAL; Future  3. Vaginal discharge - C/w yeast - fluconazole (DIFLUCAN) 150 MG tablet; Take 1 tablet (150 mg total) by mouth once for 1 dose. May repeat in 2 days if still having symptoms.  Dispense: 2 tablet; Refill: 0  4. Hx of abnormal cervical Pap smear - S/p biopsy with [facility] in 2016 followed by normal pap with [facility] 10/09/2018 - Cytology - PAP  Will follow up results of pap smear and manage accordingly. Routine preventative health maintenance measures emphasized. Please refer to After Visit Summary for other counseling recommendations.      12/08/2018, MSN, CNM Certified Nurse Midwife, [facility] for [facility] 07/16/20 2:38 PM

## 2020-07-17 LAB — CYTOLOGY - PAP
Comment: NEGATIVE
Diagnosis: NEGATIVE
High risk HPV: NEGATIVE

## 2020-07-18 ENCOUNTER — Ambulatory Visit
Admission: RE | Admit: 2020-07-18 | Discharge: 2020-07-18 | Disposition: A | Discharge status: Home / Self Care | Payer: BC Managed Care – PPO | Source: Ambulatory Visit | Attending: Advanced Practice Midwife | Admitting: Advanced Practice Midwife

## 2020-07-18 ENCOUNTER — Other Ambulatory Visit: Payer: Self-pay

## 2020-07-18 DIAGNOSIS — T8332XA Displacement of intrauterine contraceptive device, initial encounter: Secondary | ICD-10-CM | POA: Insufficient documentation

## 2020-08-26 ENCOUNTER — Telehealth: Payer: Self-pay | Admitting: Internal Medicine

## 2020-08-26 DIAGNOSIS — E538 Deficiency of other specified B group vitamins: Secondary | ICD-10-CM

## 2020-08-26 DIAGNOSIS — E559 Vitamin D deficiency, unspecified: Secondary | ICD-10-CM

## 2020-08-26 NOTE — Telephone Encounter (Signed)
Labs ordered.

## 2020-08-26 NOTE — Addendum Note (Signed)
Addended by: Lorre Munroe on: 08/26/2020 06:55 PM   Modules accepted: Orders

## 2020-08-26 NOTE — Telephone Encounter (Signed)
Pt called in wanted to know about getting labs done due to she has finished the medication and stated that NP. Baity  Needed to get labs

## 2020-10-13 ENCOUNTER — Encounter: Payer: BC Managed Care – PPO | Admitting: Internal Medicine

## 2020-11-10 ENCOUNTER — Other Ambulatory Visit: Payer: Self-pay

## 2020-11-10 ENCOUNTER — Encounter: Payer: Self-pay | Admitting: Internal Medicine

## 2020-11-10 ENCOUNTER — Ambulatory Visit (INDEPENDENT_AMBULATORY_CARE_PROVIDER_SITE_OTHER): Payer: BC Managed Care – PPO | Admitting: Internal Medicine

## 2020-11-10 VITALS — BP 114/70 | HR 71 | Temp 98.4°F | Ht 64.75 in | Wt 161.0 lb

## 2020-11-10 DIAGNOSIS — Z0001 Encounter for general adult medical examination with abnormal findings: Secondary | ICD-10-CM

## 2020-11-10 DIAGNOSIS — F5101 Primary insomnia: Secondary | ICD-10-CM | POA: Diagnosis not present

## 2020-11-10 DIAGNOSIS — Z0000 Encounter for general adult medical examination without abnormal findings: Secondary | ICD-10-CM | POA: Diagnosis not present

## 2020-11-10 DIAGNOSIS — K219 Gastro-esophageal reflux disease without esophagitis: Secondary | ICD-10-CM | POA: Insufficient documentation

## 2020-11-10 LAB — COMPREHENSIVE METABOLIC PANEL
ALT: 13 U/L (ref 0–35)
AST: 15 U/L (ref 0–37)
Albumin: 4.2 g/dL (ref 3.5–5.2)
Alkaline Phosphatase: 55 U/L (ref 39–117)
BUN: 11 mg/dL (ref 6–23)
CO2: 29 mEq/L (ref 19–32)
Calcium: 9.5 mg/dL (ref 8.4–10.5)
Chloride: 104 mEq/L (ref 96–112)
Creatinine, Ser: 0.62 mg/dL (ref 0.40–1.20)
GFR: 116.74 mL/min (ref >60.00)
Glucose, Bld: 94 mg/dL (ref 70–99)
Potassium: 4 mEq/L (ref 3.5–5.1)
Sodium: 139 mEq/L (ref 135–145)
Total Bilirubin: 1 mg/dL (ref 0.2–1.2)
Total Protein: 6.8 g/dL (ref 6.0–8.3)

## 2020-11-10 LAB — CBC
HCT: 39.3 % (ref 36.0–46.0)
Hemoglobin: 13.1 g/dL (ref 12.0–15.0)
MCHC: 33.4 g/dL (ref 30.0–36.0)
MCV: 84.2 fl (ref 78.0–100.0)
Platelets: 181 10*3/uL (ref 150.0–400.0)
RBC: 4.67 Mil/uL (ref 3.87–5.11)
RDW: 13.5 % (ref 11.5–15.5)
WBC: 5 10*3/uL (ref 4.0–10.5)

## 2020-11-10 LAB — LIPID PANEL
Cholesterol: 165 mg/dL (ref 0–200)
HDL: 42.7 mg/dL (ref >39.00)
LDL Cholesterol: 102 mg/dL — ABNORMAL HIGH (ref 0–99)
NonHDL: 122.72
Total CHOL/HDL Ratio: 4
Triglycerides: 102 mg/dL (ref 0.0–149.0)
VLDL: 20.4 mg/dL (ref 0.0–40.0)

## 2020-11-10 LAB — HEMOGLOBIN A1C: Hgb A1c MFr Bld: 5.1 % (ref 4.6–6.5)

## 2020-11-10 NOTE — Progress Notes (Signed)
Subjective:    Patient ID: Brittany [name], female    DOB: Aug 17, 1986, 35 y.o.   MRN: [id number]  HPI  Pt presents to the clinic today for her annual exam. She is also due to follow up chronic conditions.  GERD: Currently not an issue. She is no longer taking Omeprazole. There is no upper GI on file.  Insomnia: She has not been having as much trouble sleeping lately. She takes Trazadone as needed with good results. There is no sleep study on file.  She is concerned about a coating on her tongue. She noticed this 1.5 months ago. She denies itching or burning. She does feel like she has an altered sense of taste but not smell. She has gargled with salt water and she brushes her tongue daily.  Flu: 05/2020 Tetanus: 07/2015 Covid: Moderna x 3 Pap Smear: 07/2020 Dentist: annually  Diet: She does eat meat. She consumes fruits and veggies daily. She does eat some fried foods. She drinks mostly water, soda, tea, juice, milk. Exercise: None  Review of Systems      Past Medical History:  Diagnosis Date  . Carcinoma in situ of uterine cervix    seen by Dr Andrey Farmer of [facility] onc.  rec. conization after delivery    Current Outpatient Medications  Medication Sig Dispense Refill  . Cyanocobalamin (VITAMIN B 12) 500 MCG TABS Take by mouth in the morning and at bedtime.    Marland Kitchen levonorgestrel (MIRENA) 20 MCG/24HR IUD Inserted 2017    . omeprazole (PRILOSEC) 20 MG capsule TAKE 1 CAPSULE BY MOUTH EVERY DAY 30 capsule 0  . traZODone (DESYREL) 50 MG tablet TAKE 0.5-1 TABLETS (25-50 MG TOTAL) BY MOUTH AT BEDTIME AS NEEDED FOR SLEEP. 90 tablet 0   No current facility-administered medications for this visit.    No Known Allergies  Family History  Problem Relation Age of Onset  . Diabetes Mother   . Heart failure Mother   . Alcohol abuse Maternal Grandmother   . Arthritis Neg Hx   . Asthma Neg Hx   . Birth defects Neg Hx   . Cancer Neg Hx   . COPD Neg Hx   . Depression Neg Hx   . Drug abuse Neg  Hx   . Early death Neg Hx   . Hearing loss Neg Hx   . Heart disease Neg Hx   . Hyperlipidemia Neg Hx   . Hypertension Neg Hx   . Kidney disease Neg Hx   . Learning disabilities Neg Hx   . Mental illness Neg Hx   . Mental retardation Neg Hx   . Miscarriages / Stillbirths Neg Hx   . Stroke Neg Hx   . Vision loss Neg Hx   . Varicose Veins Neg Hx     Social History   Socioeconomic History  . Marital status: Married    Spouse name: Not on file  . Number of children: Not on file  . Years of education: Not on file  . Highest education level: Not on file  Occupational History  . Not on file  Tobacco Use  . Smoking status: Never Smoker  . Smokeless tobacco: Never Used  Substance and Sexual Activity  . Alcohol use: No  . Drug use: No  . Sexual activity: Yes    Birth control/protection: Condom  Other Topics Concern  . Not on file  Social History Narrative  . Not on file   Social Determinants of Health   Financial Resource Strain:  Not on file  Food Insecurity: Not on file  Transportation Needs: Not on file  Physical Activity: Not on file  Stress: Not on file  Social Connections: Not on file  Intimate Partner Violence: Not on file     Constitutional: Denies fever, malaise, fatigue, headache or abrupt weight changes.  HEENT: Pt reports coating of tongue. Denies eye pain, eye redness, ear pain, ringing in the ears, wax buildup, runny nose, nasal congestion, bloody nose, or sore throat. Respiratory: Denies difficulty breathing, shortness of breath, cough or sputum production.   Cardiovascular: Denies chest pain, chest tightness, palpitations or swelling in the hands or feet.  Gastrointestinal: Pt reports intermittent constipation. Denies abdominal pain, bloating, diarrhea or blood in the stool.  GU: Denies urgency, frequency, pain with urination, burning sensation, blood in urine, odor or discharge. Musculoskeletal: Denies decrease in range of motion, difficulty with gait,  muscle pain or joint pain and swelling.  Skin: Denies redness, rashes, lesions or ulcercations.  Neurological: Pt reports insomnia. Denies dizziness, difficulty with memory, difficulty with speech or problems with balance and coordination.  Psych: Denies anxiety, depression, SI/HI.  No other specific complaints in a complete review of systems (except as listed in HPI above).  Objective:   Physical Exam   BP 114/70   Pulse 71   Temp 98.4 F (36.9 C) (Temporal)   Ht 5' 4.75" (1.645 m)   Wt 161 lb (73 kg)   SpO2 98%   BMI 27.00 kg/m   Wt Readings from Last 3 Encounters:  07/16/20 163 lb 6.4 oz (74.1 kg)  04/28/20 164 lb (74.4 kg)  02/19/20 161 lb (73 kg)    General: Appears her stated age, well developed, well nourished in NAD. Skin: Warm, dry and intact. No rashes  noted. HEENT: Head: normal shape and size; Eyes: sclera white, no icterus, conjunctiva pink, PERRLA and EOMs intact; Throat: no coating noted on tongue. Neck:  Neck supple, trachea midline. No masses, lumps or thyromegaly present.  Cardiovascular: Normal rate and rhythm. S1,S2 noted.  No murmur, rubs or gallops noted. No JVD or BLE edema.  Pulmonary/Chest: Normal effort and positive vesicular breath sounds. No respiratory distress. No wheezes, rales or ronchi noted.  Abdomen: Soft and nontender. Normal bowel sounds. No distention or masses noted. Liver, spleen and kidneys non palpable. Musculoskeletal: Strength 5/5 BUE/BLE. No difficulty with gait.  Neurological: Alert and oriented. Cranial nerves II-XII grossly intact. Coordination normal.  Psychiatric: Mood and affect normal. Behavior is normal. Judgment and thought content normal.     BMET    Component Value Date/Time   NA 136 02/19/2020 1141   K 3.9 02/19/2020 1141   CL 103 02/19/2020 1141   CO2 27 02/19/2020 1141   GLUCOSE 93 02/19/2020 1141   BUN 13 02/19/2020 1141   CREATININE 0.57 02/19/2020 1141   CREATININE 0.59 10/12/2019 1456   CALCIUM 9.7  02/19/2020 1141   GFRNONAA >90 10/04/2014 1450   GFRAA >90 10/04/2014 1450    Lipid Panel     Component Value Date/Time   CHOL 187 10/12/2019 1456   TRIG 96 10/12/2019 1456   HDL 44 (L) 10/12/2019 1456   CHOLHDL 4.3 10/12/2019 1456   VLDL 17.2 10/09/2018 1455   LDLCALC 123 (H) 10/12/2019 1456    CBC    Component Value Date/Time   WBC 7.2 02/19/2020 1141   RBC 4.81 02/19/2020 1141   HGB 13.6 02/19/2020 1141   HCT 40.1 02/19/2020 1141   PLT 197.0 02/19/2020 1141  MCV 83.5 02/19/2020 1141   MCH 28.9 10/12/2019 1456   MCHC 34.0 02/19/2020 1141   RDW 13.3 02/19/2020 1141   LYMPHSABS 4.9 (H) 10/03/2014 1804   MONOABS 0.6 10/03/2014 1804   EOSABS 0.0 10/03/2014 1804   BASOSABS 0.3 (H) 10/03/2014 1804    Hgb A1C Lab Results  Component Value Date   HGBA1C 5.1 04/28/2020           Assessment & Plan:   Preventative Health Maintenance:  Flu shot UTD Tetanus UTD Covid UTD Pap smear UTD Encouraged her to consume a balanced diet and exercise regimen Advised her to see a dentist annually Will check CBC, CMET, Lipid and today  RTC in 1 year, sooner if needed Nicki Reaper, NP This visit occurred during the SARS-CoV-2 public health emergency.  Safety protocols were in place, including screening questions prior to the visit, additional usage of staff PPE, and extensive cleaning of exam room while observing appropriate contact time as indicated for disinfecting solutions.

## 2020-11-10 NOTE — Assessment & Plan Note (Signed)
Continue Trazadone as needed Will monitor 

## 2020-11-10 NOTE — Assessment & Plan Note (Signed)
Currently not an issue Will monitor 

## 2020-11-10 NOTE — Patient Instructions (Signed)
Health Maintenance, Female Adopting a healthy lifestyle and getting preventive care are important in promoting health and wellness. Ask your health care provider about:  The right schedule for you to have regular tests and exams.  Things you can do on your own to prevent diseases and keep yourself healthy. What should I know about diet, weight, and exercise? Eat a healthy diet  Eat a diet that includes plenty of vegetables, fruits, low-fat dairy products, and lean protein.  Do not eat a lot of foods that are high in solid fats, added sugars, or sodium.   Maintain a healthy weight Body mass index (BMI) is used to identify weight problems. It estimates body fat based on height and weight. Your health care provider can help determine your BMI and help you achieve or maintain a healthy weight. Get regular exercise Get regular exercise. This is one of the most important things you can do for your health. Most adults should:  Exercise for at least 150 minutes each week. The exercise should increase your heart rate and make you sweat (moderate-intensity exercise).  Do strengthening exercises at least twice a week. This is in addition to the moderate-intensity exercise.  Spend less time sitting. Even light physical activity can be beneficial. Watch cholesterol and blood lipids Have your blood tested for lipids and cholesterol at 34 years of age, then have this test every 5 years. Have your cholesterol levels checked more often if:  Your lipid or cholesterol levels are high.  You are older than 34 years of age.  You are at high risk for heart disease. What should I know about cancer screening? Depending on your health history and family history, you may need to have cancer screening at various ages. This may include screening for:  Breast cancer.  Cervical cancer.  Colorectal cancer.  Skin cancer.  Lung cancer. What should I know about heart disease, diabetes, and high blood  pressure? Blood pressure and heart disease  High blood pressure causes heart disease and increases the risk of stroke. This is more likely to develop in people who have high blood pressure readings, are of African descent, or are overweight.  Have your blood pressure checked: ? Every 3-5 years if you are 18-39 years of age. ? Every year if you are 40 years old or older. Diabetes Have regular diabetes screenings. This checks your fasting blood sugar level. Have the screening done:  Once every three years after age 40 if you are at a normal weight and have a low risk for diabetes.  More often and at a younger age if you are overweight or have a high risk for diabetes. What should I know about preventing infection? Hepatitis B If you have a higher risk for hepatitis B, you should be screened for this virus. Talk with your health care provider to find out if you are at risk for hepatitis B infection. Hepatitis C Testing is recommended for:  Everyone born from 1945 through 1965.  Anyone with known risk factors for hepatitis C. Sexually transmitted infections (STIs)  Get screened for STIs, including gonorrhea and chlamydia, if: ? You are sexually active and are younger than 34 years of age. ? You are older than 34 years of age and your health care provider tells you that you are at risk for this type of infection. ? Your sexual activity has changed since you were last screened, and you are at increased risk for chlamydia or gonorrhea. Ask your health care provider   if you are at risk.  Ask your health care provider about whether you are at high risk for HIV. Your health care provider may recommend a prescription medicine to help prevent HIV infection. If you choose to take medicine to prevent HIV, you should first get tested for HIV. You should then be tested every 3 months for as long as you are taking the medicine. Pregnancy  If you are about to stop having your period (premenopausal) and  you may become pregnant, seek counseling before you get pregnant.  Take 400 to 800 micrograms (mcg) of folic acid every day if you become pregnant.  Ask for birth control (contraception) if you want to prevent pregnancy. Osteoporosis and menopause Osteoporosis is a disease in which the bones lose minerals and strength with aging. This can result in bone fractures. If you are 65 years old or older, or if you are at risk for osteoporosis and fractures, ask your health care provider if you should:  Be screened for bone loss.  Take a calcium or vitamin D supplement to lower your risk of fractures.  Be given hormone replacement therapy (HRT) to treat symptoms of menopause. Follow these instructions at home: Lifestyle  Do not use any products that contain nicotine or tobacco, such as cigarettes, e-cigarettes, and chewing tobacco. If you need help quitting, ask your health care provider.  Do not use street drugs.  Do not share needles.  Ask your health care provider for help if you need support or information about quitting drugs. Alcohol use  Do not drink alcohol if: ? Your health care provider tells you not to drink. ? You are pregnant, may be pregnant, or are planning to become pregnant.  If you drink alcohol: ? Limit how much you use to 0-1 drink a day. ? Limit intake if you are breastfeeding.  Be aware of how much alcohol is in your drink. In the [place] one drink equals one 12 oz bottle of beer (355 mL), one 5 oz glass of wine (148 mL), or one 1 oz glass of hard liquor (44 mL). General instructions  Schedule regular health, dental, and eye exams.  Stay current with your vaccines.  Tell your health care provider if: ? You often feel depressed. ? You have ever been abused or do not feel safe at home. Summary  Adopting a healthy lifestyle and getting preventive care are important in promoting health and wellness.  Follow your health care provider's instructions about healthy  diet, exercising, and getting tested or screened for diseases.  Follow your health care provider's instructions on monitoring your cholesterol and blood pressure. This information is not intended to replace advice given to you by your health care provider. Make sure you discuss any questions you have with your health care provider. Document Revised: 08/16/2018 Document Reviewed: 08/16/2018 Elsevier Patient Education  2021 [employer]vier Inc.  

## 2021-01-07 ENCOUNTER — Telehealth: Payer: Self-pay

## 2021-01-07 NOTE — Telephone Encounter (Signed)
Patient called sates she has had ear pain Lt > Rt for about a week. She has had cough and patient sounds congested during phone conversation. I have offered patient a virtual appointment but declined  States that someone needs to look in her ear. Advised patient will send message for review to see if she can come in the office.

## 2021-01-07 NOTE — Telephone Encounter (Signed)
Offered 4pm tomorrow with Dr. Alphonsus Sias; however, patient can't come until Friday.  Will speak with providers tomorrow and see where we can work her in on Friday and call patient back.   Thanks.

## 2021-01-08 NOTE — Telephone Encounter (Signed)
12:30 appt for [place] on 5/6 is on hold for patient if she returns call

## 2021-01-08 NOTE — Telephone Encounter (Signed)
Called and left vm for the patient to call us back. No Details left as there no info on DPR> need to offer 12:30 tom. 01/09/21  EM

## 2021-05-06 ENCOUNTER — Telehealth (INDEPENDENT_AMBULATORY_CARE_PROVIDER_SITE_OTHER): Payer: BC Managed Care – PPO | Admitting: Nurse Practitioner

## 2021-05-06 ENCOUNTER — Telehealth: Payer: Self-pay | Admitting: *Deleted

## 2021-05-06 ENCOUNTER — Encounter: Payer: Self-pay | Admitting: Nurse Practitioner

## 2021-05-06 VITALS — Ht 64.75 in | Wt 150.0 lb

## 2021-05-06 DIAGNOSIS — U071 COVID-19: Secondary | ICD-10-CM | POA: Insufficient documentation

## 2021-05-06 DIAGNOSIS — R112 Nausea with vomiting, unspecified: Secondary | ICD-10-CM

## 2021-05-06 DIAGNOSIS — R1110 Vomiting, unspecified: Secondary | ICD-10-CM | POA: Insufficient documentation

## 2021-05-06 MED ORDER — ONDANSETRON HCL 4 MG PO TABS: 4.0000 mg | ORAL_TABLET | Freq: Three times a day (TID) | ORAL | 0 refills | Status: AC | PRN

## 2021-05-06 NOTE — Assessment & Plan Note (Signed)
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 States that she is experiencing vomiting.

## 2021-05-06 NOTE — Progress Notes (Signed)
Patient ID: Brittany [name], female    DOB: [birth year], 35 y.o.   MRN: [id number]  Virtual visit completed through [employer], a video enabled telemedicine application. Due to national recommendations of social distancing due to COVID-19, a virtual visit is felt to be most appropriate for this patient at this time. Reviewed limitations, risks, security and privacy concerns of performing a virtual visit and the availability of in person appointments. I also reviewed that there may be a patient responsible charge related to this service. The patient agreed to proceed.   Patient location: home Provider location: [place] [facility], office Persons participating in this virtual visit: patient, provider   If any vitals were documented, they were collected by patient at home unless specified below.    Ht 5' 4.75" (1.645 m)   Wt 150 lb (68 kg)   BMI 25.15 kg/m    CC: Covid 19 Infection, Nausea, Vomiting, and Cough Subjective:   HPI: Brittany [name] is a 35 y.o. 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 Her symptoms started saturday 05/02/2021 and then tested positive  for covid 19 on Sunday 05/03/2021.Marland Kitchen Having N/V and headache. States she is vomiting around 3 times daily. States she has tried to drink water and vomited it back up. Has not been able to keep food down. Also having a cough that is non productive.       Relevant past medical, surgical, family and social history reviewed and updated as indicated. Interim medical history since our last visit reviewed. Allergies and medications reviewed and updated. Outpatient Medications Prior to Visit  Medication Sig Dispense Refill   levonorgestrel (MIRENA) 20 MCG/24HR IUD Inserted 2017     traZODone (DESYREL) 50 MG tablet TAKE 0.5-1 TABLETS (25-50 MG TOTAL) BY MOUTH AT BEDTIME AS NEEDED FOR SLEEP. 90 tablet 0   No facility-administered medications prior to visit.     Per HPI  unless specifically indicated in ROS section below Review of Systems  Constitutional:  Negative for chills and fever.  HENT:  Negative for sore throat.   Respiratory:  Positive for cough (non productive). Negative for shortness of breath.   Cardiovascular:  Negative for chest pain.  Gastrointestinal:  Positive for nausea and vomiting. Negative for abdominal pain and diarrhea.  Neurological:  Positive for headaches (states they have resolved for now).  Objective:  Ht 5' 4.75" (1.645 m)   Wt 150 lb (68 kg)   BMI 25.15 kg/m   Wt Readings from Last 3 Encounters:  05/06/21 150 lb (68 kg)  11/10/20 161 lb (73 kg)  07/16/20 163 lb 6.4 oz (74.1 kg)       Physical exam: Gen: alert, NAD, not ill appearing Pulm: speaks in complete sentences without increased work of breathing Psych: normal mood, normal thought content      Results for orders placed or performed in visit on 11/10/20  CBC  Result Value Ref Range   WBC 5.0 4.0 - 10.5 K/uL   RBC 4.67 3.87 - 5.11 Mil/uL   Platelets 181.0 150.0 - 400.0 K/uL   Hemoglobin 13.1 12.0 - 15.0 g/dL   HCT 92.1 19.4 - 17.4 %   MCV 84.2 78.0 - 100.0 fl   MCHC 33.4 30.0 - 36.0 g/dL   RDW 08.1 44.8 - 18.5 %  Comprehensive metabolic panel  Result Value Ref Range   Sodium 139 135 - 145 mEq/L   Potassium 4.0 3.5 - 5.1 mEq/L  Chloride 104 96 - 112 mEq/L   CO2 29 19 - 32 mEq/L   Glucose, Bld 94 70 - 99 mg/dL   BUN 11 6 - 23 mg/dL   Creatinine, Ser 6.12 0.40 - 1.20 mg/dL   Total Bilirubin 1.0 0.2 - 1.2 mg/dL   Alkaline Phosphatase 55 39 - 117 U/L   AST 15 0 - 37 U/L   ALT 13 0 - 35 U/L   Total Protein 6.8 6.0 - 8.3 g/dL   Albumin 4.2 3.5 - 5.2 g/dL   GFR 244.97 >53.00 mL/min   Calcium 9.5 8.4 - 10.5 mg/dL  Lipid panel  Result Value Ref Range   Cholesterol 165 0 - 200 mg/dL   Triglycerides 511.0 0.0 - 149.0 mg/dL   HDL 21.11 >73.56 mg/dL   VLDL 70.1 0.0 - 41.0 mg/dL   LDL Cholesterol 301 (H) 0 - 99 mg/dL   Total CHOL/HDL Ratio 4    NonHDL  122.72   Hemoglobin A1c  Result Value Ref Range   Hgb A1c MFr Bld 5.1 4.6 - 6.5 %   Assessment & Plan:   Problem List Items Addressed This Visit       Digestive   Intractable vomiting    States since being diagnosed with Covid-19 she has been having nausea and vomiting. Unable to get it to stop. Has been trying to push fluids of water and Pedialyte. Has vomited the water but she has just tried the Pedialyte.  Will start Zofran 4mg  ODT q8 hours PRN for nausea and vomiting.       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 States that she is experiencing vomiting.        No orders of the defined types were placed in this encounter.  No orders of the defined types were placed in this encounter.   I discussed the assessment and treatment plan with the patient. The patient was provided an opportunity to ask questions and all were answered. The patient agreed with the plan and demonstrated an understanding of the instructions. The patient was advised to call back or seek an in-person evaluation if the symptoms worsen or if the condition fails to improve as anticipated.  Follow up plan: Return if symptoms worsen or fail to improve.  05/05/2021, NP

## 2021-05-06 NOTE — Telephone Encounter (Signed)
Noted  

## 2021-05-06 NOTE — Telephone Encounter (Signed)
Patient called stating that she tested positive for covid Sunday. Patient stated that she has a cough and some nausea. Patient stated that would like  medication for the nausea. Patient was seen by Nicki Reaper NP and does want to continue her care here. Patient stated that she did have a headache but that is gone.Patient scheduled for an virtual visit with Audria Nine NP today 05/06/21. Patient was given ER precautions and she verbalized understanding.Patient was given ER precautions and she verbalized understanding.

## 2021-05-06 NOTE — Assessment & Plan Note (Signed)
States since being diagnosed with Covid-19 she has been having nausea and vomiting. Unable to get it to stop. Has been trying to push fluids of water and Pedialyte. Has vomited the water but she has just tried the Pedialyte.  Will start Zofran 4mg  ODT q8 hours PRN for nausea and vomiting.

## 2021-06-17 ENCOUNTER — Ambulatory Visit: Payer: BC Managed Care – PPO | Admitting: Family Medicine

## 2021-06-17 ENCOUNTER — Other Ambulatory Visit: Payer: Self-pay

## 2021-06-17 VITALS — BP 110/60 | HR 77 | Temp 97.0°F | Ht 64.75 in | Wt 152.0 lb

## 2021-06-17 DIAGNOSIS — R202 Paresthesia of skin: Secondary | ICD-10-CM

## 2021-06-17 DIAGNOSIS — E559 Vitamin D deficiency, unspecified: Secondary | ICD-10-CM

## 2021-06-17 DIAGNOSIS — L659 Nonscarring hair loss, unspecified: Secondary | ICD-10-CM | POA: Insufficient documentation

## 2021-06-17 DIAGNOSIS — E538 Deficiency of other specified B group vitamins: Secondary | ICD-10-CM

## 2021-06-17 LAB — CBC
HCT: 38.5 % (ref 36.0–46.0)
Hemoglobin: 12.8 g/dL (ref 12.0–15.0)
MCHC: 33.3 g/dL (ref 30.0–36.0)
MCV: 83.3 fl (ref 78.0–100.0)
Platelets: 191 10*3/uL (ref 150.0–400.0)
RBC: 4.63 Mil/uL (ref 3.87–5.11)
RDW: 13.7 % (ref 11.5–15.5)
WBC: 6.2 10*3/uL (ref 4.0–10.5)

## 2021-06-17 LAB — COMPREHENSIVE METABOLIC PANEL
ALT: 13 U/L (ref 0–35)
AST: 12 U/L (ref 0–37)
Albumin: 4.2 g/dL (ref 3.5–5.2)
Alkaline Phosphatase: 64 U/L (ref 39–117)
BUN: 9 mg/dL (ref 6–23)
CO2: 28 mEq/L (ref 19–32)
Calcium: 9.1 mg/dL (ref 8.4–10.5)
Chloride: 106 mEq/L (ref 96–112)
Creatinine, Ser: 0.54 mg/dL (ref 0.40–1.20)
GFR: 120.18 mL/min (ref >60.00)
Glucose, Bld: 80 mg/dL (ref 70–99)
Potassium: 3.9 mEq/L (ref 3.5–5.1)
Sodium: 139 mEq/L (ref 135–145)
Total Bilirubin: 0.7 mg/dL (ref 0.2–1.2)
Total Protein: 6.7 g/dL (ref 6.0–8.3)

## 2021-06-17 LAB — FERRITIN: Ferritin: 62.8 ng/mL (ref 10.0–291.0)

## 2021-06-17 LAB — VITAMIN B12: Vitamin B-12: 337 pg/mL (ref 211–911)

## 2021-06-17 LAB — VITAMIN D 25 HYDROXY (VIT D DEFICIENCY, FRACTURES): VITD: 18.15 ng/mL — ABNORMAL LOW (ref 30.00–100.00)

## 2021-06-17 LAB — TSH: TSH: 1.62 u[IU]/mL (ref 0.35–5.50)

## 2021-06-17 NOTE — Patient Instructions (Signed)
Labs today  Will update the results are back

## 2021-06-17 NOTE — Assessment & Plan Note (Signed)
Did have covid in August which could be contributing to fatigue/hair loss. Will check thyroid and ferritin to evaluate for deficiency.

## 2021-06-17 NOTE — Assessment & Plan Note (Signed)
Recurrent symptom. Previously resolved with treatment for Vit B12/D deficiency. No active bleeding. Exam - neg spurling but did get shoulder/arm numbness with phallens? Will get labs if no cause may recommend physical therapy, course of steroids or sports medicine evaluation.

## 2021-06-17 NOTE — Progress Notes (Signed)
Subjective:     Brittany [name] is a 35 y.o. female presenting for Tingling (R arm x 1 month. Hx of this same thing last year and her B12/D were low. HD supplements taken and sx resolved. )     HPI  #Right arm - tingling in the arm - rare symptoms in the hand - wrist to shoulder - occasional hands - comes and goes - no specific trigger - no shoulder, wrist, elbow pain - mild neck pain - symptoms x 1 month - in the past had this and improved with treatment of Vit b12 and vit d - but did not continue daily supplement - has noticed some more hair loss than normal - more in the drain with wash and in the hair brush - endorses feeling tired - has had covid in the end of august - slow improvement since covid - no changes to skin - no depression or anxiety - sleep is OK - does toss and turn but not awake for extended periods of time  No periods on mirena No blood in stool or urine  Diet - normal, well rounded diet - eats meat  Review of Systems   Social History   Tobacco Use  Smoking Status Never  Smokeless Tobacco Never        Objective:    BP Readings from Last 3 Encounters:  06/17/21 110/60  11/10/20 114/70  07/16/20 130/80   Wt Readings from Last 3 Encounters:  06/17/21 152 lb (68.9 kg)  05/06/21 150 lb (68 kg)  11/10/20 161 lb (73 kg)    BP 110/60   Pulse 77   Temp (!) 97 F (36.1 C) (Temporal)   Ht 5' 4.75" (1.645 m)   Wt 152 lb (68.9 kg)   SpO2 98%   BMI 25.49 kg/m    Physical Exam Constitutional:      General: She is not in acute distress.    Appearance: She is well-developed. She is not diaphoretic.  HENT:     Right Ear: External ear normal.     Left Ear: External ear normal.  Eyes:     Conjunctiva/sclera: Conjunctivae normal.  Neck:     Comments: Spurling negative Cardiovascular:     Rate and Rhythm: Normal rate and regular rhythm.     Heart sounds: No murmur heard. Pulmonary:     Effort: Pulmonary effort is normal. No  respiratory distress.     Breath sounds: Normal breath sounds. No wheezing.  Musculoskeletal:     Cervical back: Neck supple.     Comments: Elbow - negative tinel Wrist - negative tinel, phallen maneuver does cause some shoulder pain and tingling along the arm  Skin:    General: Skin is warm and dry.     Capillary Refill: Capillary refill takes less than 2 seconds.  Neurological:     Mental Status: She is alert. Mental status is at baseline.  Psychiatric:        Mood and Affect: Mood normal.        Behavior: Behavior normal.          Assessment & Plan:   Problem List Items Addressed This Visit       Other   Paresthesia of left upper extremity    Recurrent symptom. Previously resolved with treatment for Vit B12/D deficiency. No active bleeding. Exam - neg spurling but did get shoulder/arm numbness with phallens? Will get labs if no cause may recommend physical therapy, course of steroids  or sports medicine evaluation.       Relevant Orders   Comprehensive metabolic panel   TSH   Vitamin D, 25-hydroxy   Vitamin B12   CBC   Ferritin   Hair loss    Did have covid in August which could be contributing to fatigue/hair loss. Will check thyroid and ferritin to evaluate for deficiency.       Relevant Orders   Comprehensive metabolic panel   Ferritin   Other Visit Diagnoses     Vitamin D deficiency    -  Primary   Relevant Orders   Comprehensive metabolic panel   Vitamin D, 25-hydroxy   Vitamin B12 deficiency       Relevant Orders   Comprehensive metabolic panel   Vitamin B12        Return if symptoms worsen or fail to improve.  Lynnda Child, MD  This visit occurred during the SARS-CoV-2 public health emergency.  Safety protocols were in place, including screening questions prior to the visit, additional usage of staff PPE, and extensive cleaning of exam room while observing appropriate contact time as indicated for disinfecting solutions.

## 2021-06-18 ENCOUNTER — Other Ambulatory Visit: Payer: Self-pay | Admitting: Internal Medicine

## 2021-06-18 DIAGNOSIS — E538 Deficiency of other specified B group vitamins: Secondary | ICD-10-CM

## 2021-06-18 DIAGNOSIS — E559 Vitamin D deficiency, unspecified: Secondary | ICD-10-CM

## 2021-06-18 MED ORDER — VITAMIN D (ERGOCALCIFEROL) 1.25 MG (50000 UNIT) PO CAPS: 50000.0000 [IU] | ORAL_CAPSULE | ORAL | 0 refills | Status: DC

## 2021-06-19 ENCOUNTER — Encounter: Payer: Self-pay | Admitting: Radiology

## 2021-08-21 IMAGING — US US PELVIS COMPLETE WITH TRANSVAGINAL
1 series · 14 of 25 positions shown · non-contrast
Comparison: None

CLINICAL DATA: Locate IUD

EXAM:
TRANSABDOMINAL AND TRANSVAGINAL ULTRASOUND OF PELVIS
TECHNIQUE: Both transabdominal and transvaginal ultrasound examinations of the
pelvis were performed. Transabdominal technique was performed for
global imaging of the pelvis including uterus, ovaries, adnexal
regions, and pelvic cul-de-sac. It was necessary to proceed with
endovaginal exam following the transabdominal exam to visualize the
uterus endometrium ovaries.

[Series 1: us pelvic complete with transvaginal · 14 of 76 slices shown]
[im 1/76]
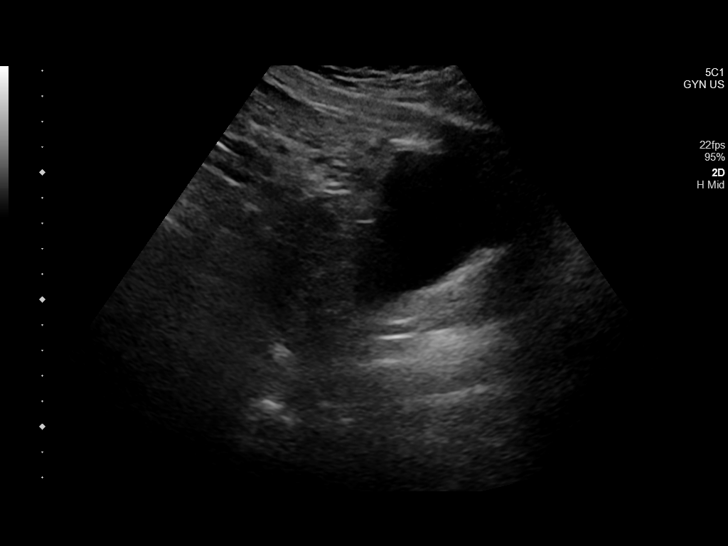
[im 7/76]
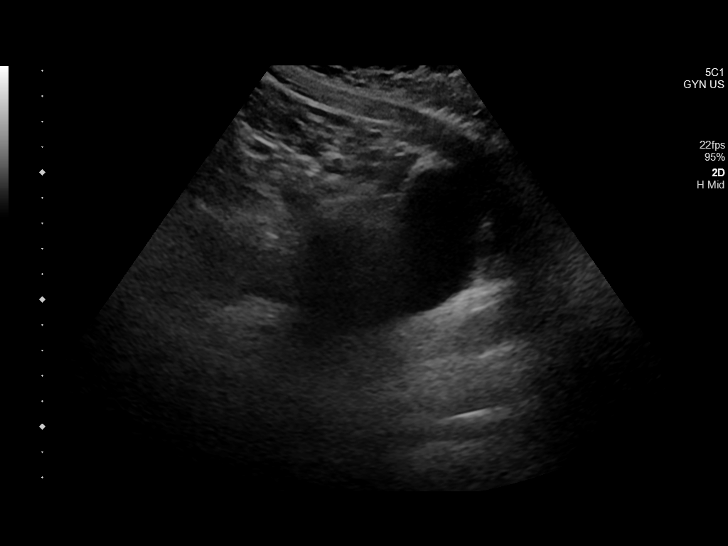
[im 13/76]
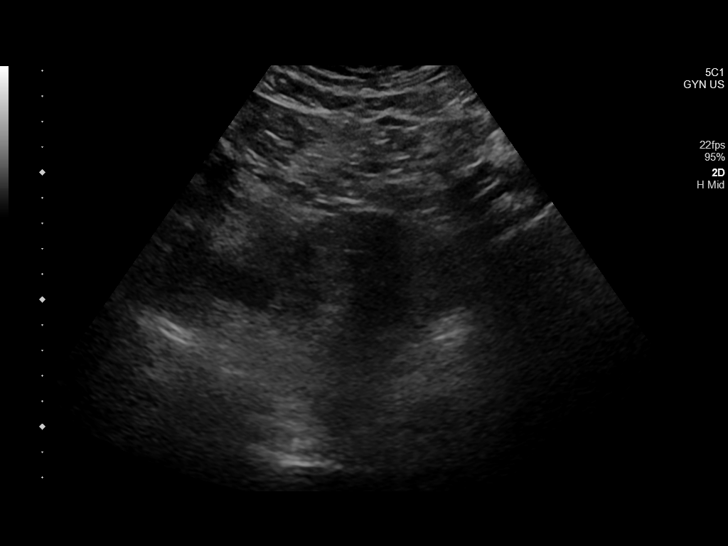
[im 19/76]
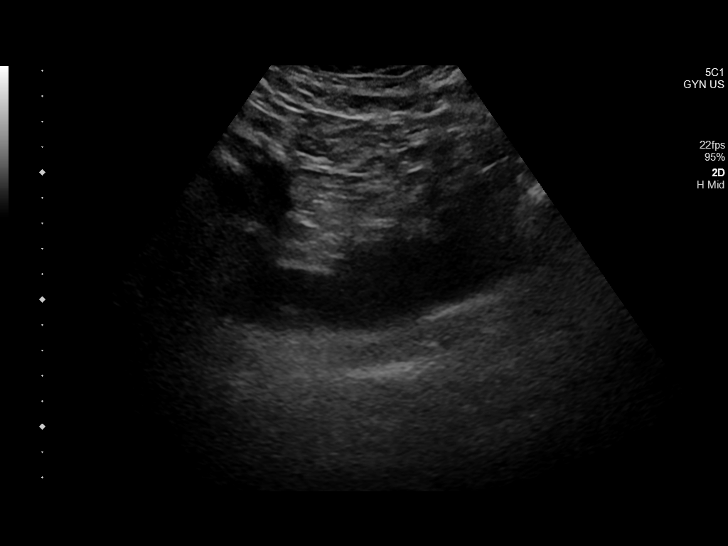
[im 26/76]
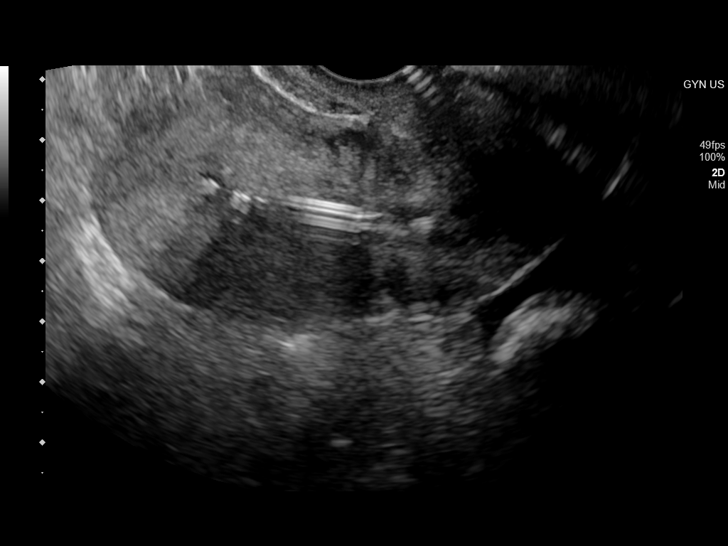
[im 29/76]
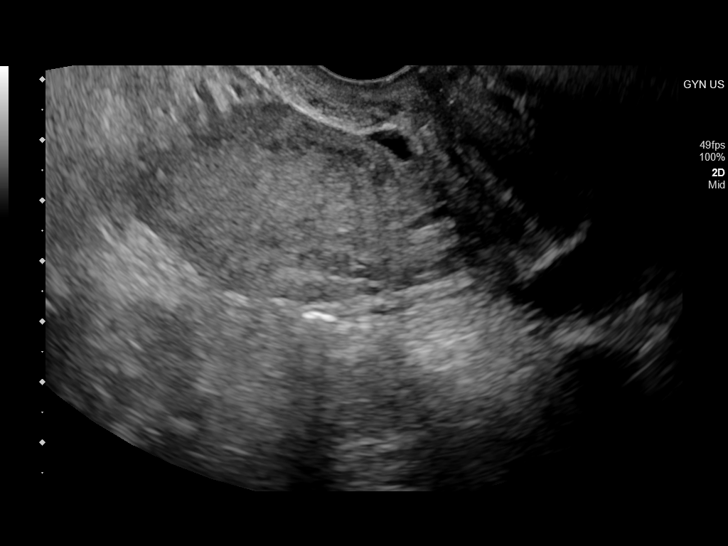
[im 35/76]
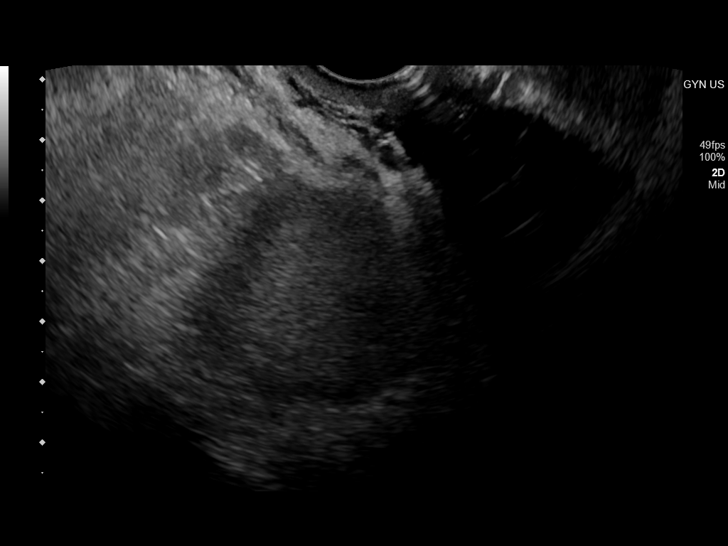
[im 41/76]
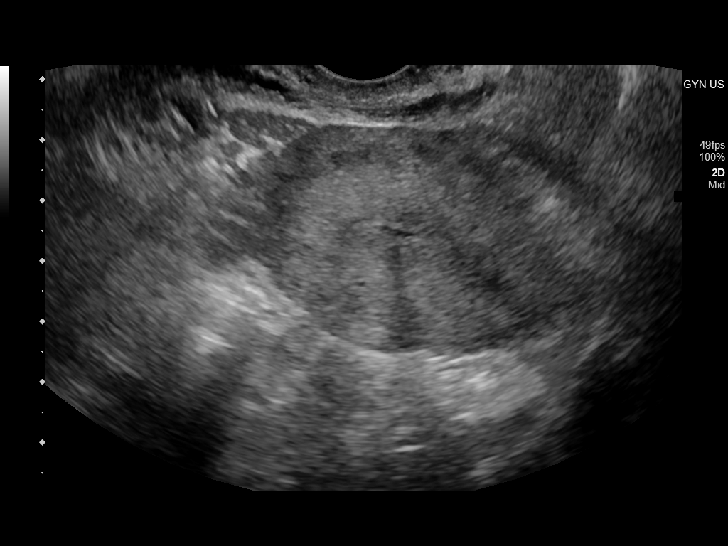
[im 47/76]
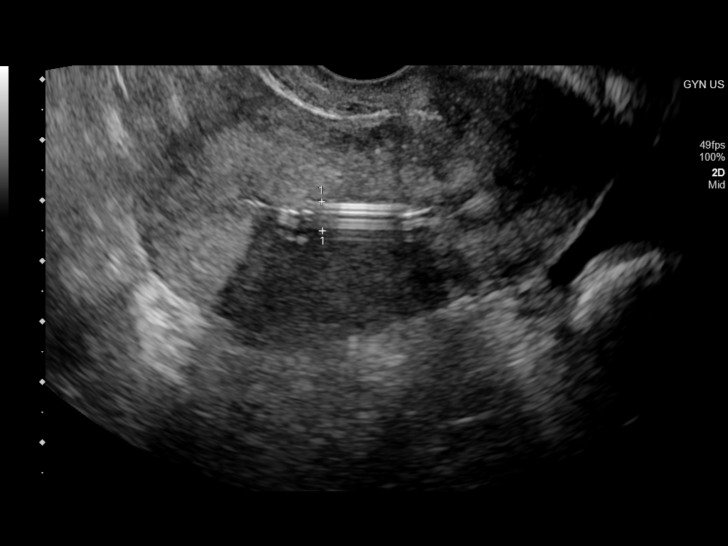
[im 51/76]
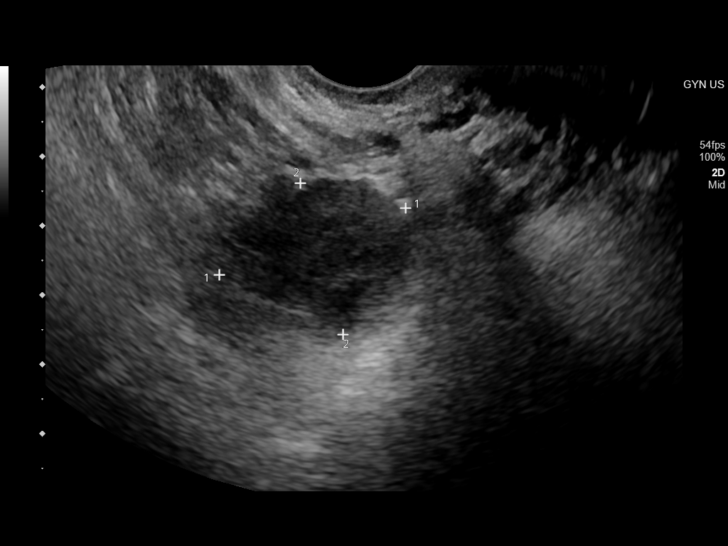
[im 57/76]
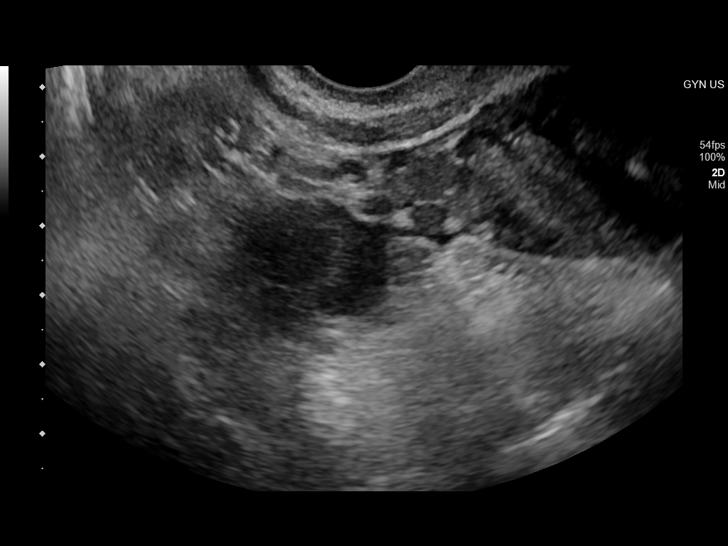
[im 63/76]
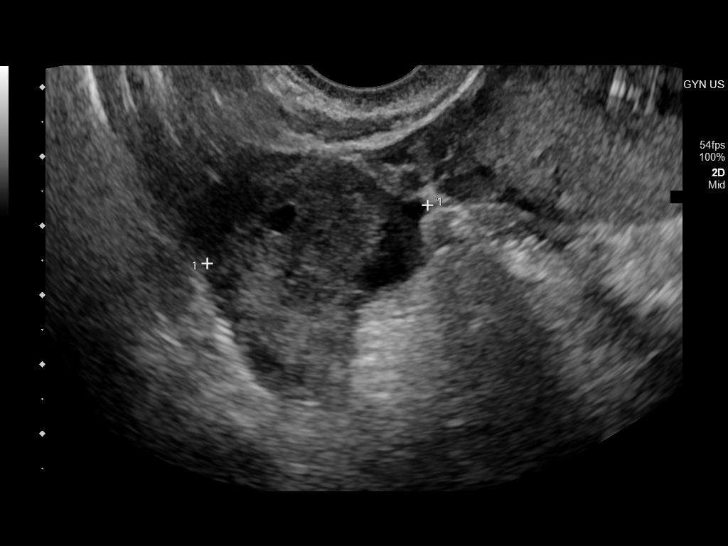
[im 69/76]
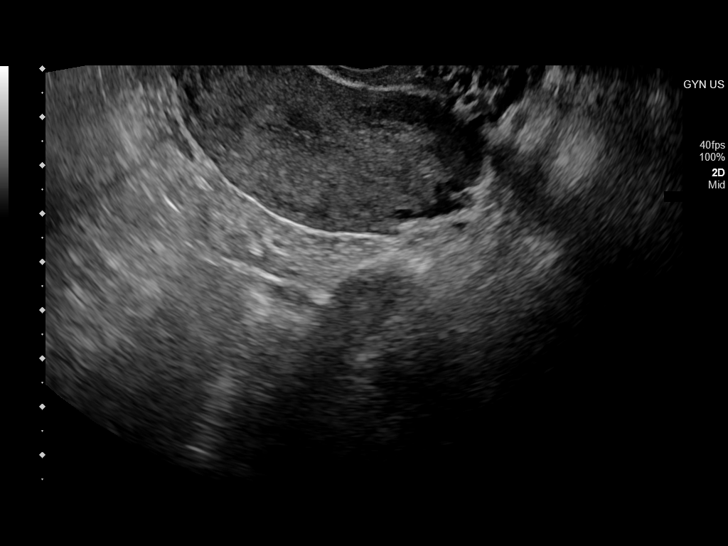
[im 76/76]
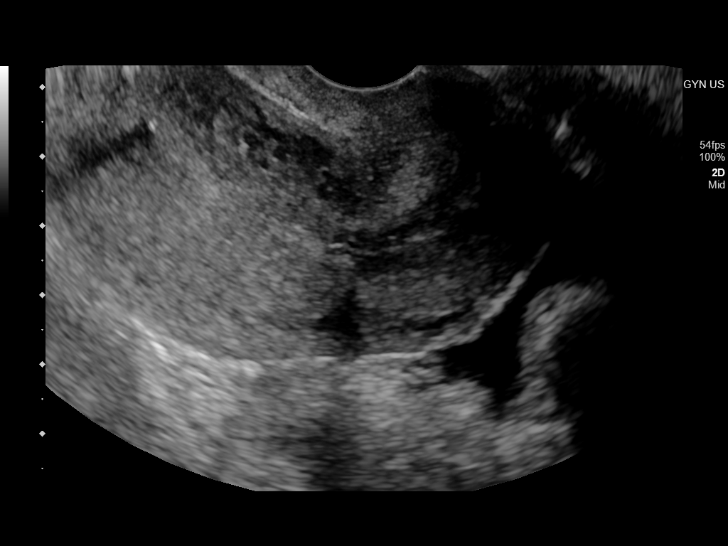

[14 of 25 positions shown; findings below may reference images not displayed]

FINDINGS: Uterus

Measurements: 8.4 x 4 x 5.7 cm = volume: 102 mL. No fibroids or
other mass visualized.

Endometrium

Thickness: 4.8 mm.  IUD within the uterine body and fundus.

Right ovary

Measurements: 2.8 x 2.3 x 3.3 cm = volume: 11 mL. Normal
appearance/no adnexal mass.

Left ovary

Measurements: 2.6 x 1.3 x 0.8 cm = volume: 1.6 mL. Normal
appearance/no adnexal mass.

Other findings

Trace free fluid
IMPRESSION: 1. IUD appears grossly appropriate in position by sonography.
2. Trace free fluid.

## 2021-09-02 ENCOUNTER — Other Ambulatory Visit: Payer: Self-pay | Admitting: Internal Medicine

## 2021-11-12 ENCOUNTER — Encounter: Payer: BC Managed Care – PPO | Admitting: Internal Medicine

## 2022-03-04 ENCOUNTER — Encounter: Payer: Self-pay | Admitting: Nurse Practitioner

## 2022-03-04 ENCOUNTER — Ambulatory Visit (INDEPENDENT_AMBULATORY_CARE_PROVIDER_SITE_OTHER): Payer: BC Managed Care – PPO | Admitting: Nurse Practitioner

## 2022-03-04 VITALS — BP 112/76 | HR 70 | Temp 97.8°F | Resp 12 | Ht 64.75 in | Wt 151.2 lb

## 2022-03-04 DIAGNOSIS — R350 Frequency of micturition: Secondary | ICD-10-CM | POA: Diagnosis not present

## 2022-03-04 DIAGNOSIS — N3091 Cystitis, unspecified with hematuria: Secondary | ICD-10-CM | POA: Diagnosis not present

## 2022-03-04 DIAGNOSIS — R300 Dysuria: Secondary | ICD-10-CM | POA: Diagnosis not present

## 2022-03-04 LAB — POCT URINALYSIS DIPSTICK
Bilirubin, UA: NEGATIVE
Blood, UA: POSITIVE
Glucose, UA: NEGATIVE
Ketones, UA: NEGATIVE
Leukocytes, UA: NEGATIVE
Nitrite, UA: NEGATIVE
Protein, UA: POSITIVE — AB
Spec Grav, UA: 1.02 (ref 1.010–1.025)
Urobilinogen, UA: 1 E.U./dL
pH, UA: 6 (ref 5.0–8.0)

## 2022-03-04 MED ORDER — NITROFURANTOIN MONOHYD MACRO 100 MG PO CAPS: 100.0000 mg | ORAL_CAPSULE | Freq: Two times a day (BID) | ORAL | 0 refills | Status: AC

## 2022-03-04 NOTE — Progress Notes (Signed)
Acute Office Visit  Subjective:     Patient ID: Brittany [name], female    DOB: Jan 18, 1986, 36 y.o.   MRN: [id number]  Chief Complaint  Patient presents with   burning with urination    Sx started around 2 weeks ago. Frequent urination, slight urine odor. No blood in the urine, nausea or abdominal pain.     HPI Patient is in today for urinary complaints  States that it started approx 2 weeks ago. States that she went to a indoor water park and then that is when it started. States that she is having dysuria, frequnecy and smell to her urine.   Review of Systems  Constitutional:  Negative for chills and fever.  Gastrointestinal:  Negative for abdominal pain, nausea and vomiting.  Genitourinary:  Positive for dysuria and frequency. Negative for hematuria and urgency.  Musculoskeletal:  Negative for back pain.        Objective:    BP 112/76   Pulse 70   Temp 97.8 F (36.6 C)   Resp 12   Ht 5' 4.75" (1.645 m)   Wt 151 lb 4 oz (68.6 kg)   LMP 01/01/2022   SpO2 99%   Breastfeeding No   BMI 25.36 kg/m    Physical Exam Vitals and nursing note reviewed.  Constitutional:      Appearance: Normal appearance.  Cardiovascular:     Rate and Rhythm: Normal rate and regular rhythm.     Heart sounds: Normal heart sounds.  Pulmonary:     Effort: Pulmonary effort is normal.     Breath sounds: Normal breath sounds.  Abdominal:     General: Bowel sounds are normal. There is no distension.     Palpations: There is no mass.     Tenderness: There is abdominal tenderness in the suprapubic area. There is no right CVA tenderness or left CVA tenderness.     Hernia: No hernia is present.  Neurological:     Mental Status: She is alert.    Results for orders placed or performed in visit on 03/04/22  POCT urinalysis dipstick  Result Value Ref Range   Color, UA dark yellow    Clarity, UA clear    Glucose, UA Negative Negative   Bilirubin, UA negative    Ketones, UA negative     Spec Grav, UA 1.020 1.010 - 1.025   Blood, UA large-positive    pH, UA 6.0 5.0 - 8.0   Protein, UA Positive (A) Negative   Urobilinogen, UA 1.0 0.2 or 1.0 E.U./dL   Nitrite, UA negative    Leukocytes, UA Negative Negative   Appearance     Odor          Assessment & Plan:   Problem List Items Addressed This Visit       Genitourinary   Cystitis with hematuria    Given length of illness positive hematuria and exam will like to treat proactively for suspected cystitis with hematuria.  Will prescribe patient Macrobid 100 mg twice daily for 5 days.  Pending urine culture      Relevant Medications   nitrofurantoin, macrocrystal-monohydrate, (MACROBID) 100 MG capsule     Other   Urinary frequency - Primary    Abnormal urinalysis.  Elect to treat for UTI.  Pending urine culture      Relevant Orders   POCT urinalysis dipstick (Completed)   Urine Culture   Dysuria    UA with 3+ red blood cells.  Negative leuk,  negative nitrite.  Pending urine culture       Meds ordered this encounter  Medications   nitrofurantoin, macrocrystal-monohydrate, (MACROBID) 100 MG capsule    Sig: Take 1 capsule (100 mg total) by mouth 2 (two) times daily for 5 days.    Dispense:  10 capsule    Refill:  0    Order Specific Question:   Supervising Provider    Answer:   Roxy Manns A [[id number]]    Return for Needs a TOC with Tabitha.  Audria Nine, NP

## 2022-03-04 NOTE — Assessment & Plan Note (Signed)
Given length of illness positive hematuria and exam will like to treat proactively for suspected cystitis with hematuria.  Will prescribe patient Macrobid 100 mg twice daily for 5 days.  Pending urine culture

## 2022-03-04 NOTE — Assessment & Plan Note (Signed)
Abnormal urinalysis.  Elect to treat for UTI.  Pending urine culture

## 2022-03-04 NOTE — Assessment & Plan Note (Signed)
UA with 3+ red blood cells.  Negative leuk, negative nitrite.  Pending urine culture

## 2022-03-04 NOTE — Patient Instructions (Signed)
Nice to see you today I sent in the antibiotic to your pharmacy I will be in touch with the urine culture results once I have them Follow up if no improvement Also make a Transfer of Care appointment with [place]

## 2022-03-05 LAB — URINE CULTURE
MICRO NUMBER:: 13589074
Result:: NO GROWTH
SPECIMEN QUALITY:: ADEQUATE

## 2022-04-21 ENCOUNTER — Encounter: Payer: Self-pay | Admitting: Family

## 2022-04-21 ENCOUNTER — Other Ambulatory Visit: Payer: Self-pay | Admitting: Family

## 2022-04-21 ENCOUNTER — Ambulatory Visit: Payer: BC Managed Care – PPO | Admitting: Family

## 2022-04-21 VITALS — BP 122/74 | HR 64 | Temp 98.7°F | Resp 16 | Ht 64.75 in | Wt 156.2 lb

## 2022-04-21 DIAGNOSIS — R5383 Other fatigue: Secondary | ICD-10-CM | POA: Diagnosis not present

## 2022-04-21 DIAGNOSIS — E559 Vitamin D deficiency, unspecified: Secondary | ICD-10-CM

## 2022-04-21 DIAGNOSIS — O99019 Anemia complicating pregnancy, unspecified trimester: Secondary | ICD-10-CM

## 2022-04-21 DIAGNOSIS — F5101 Primary insomnia: Secondary | ICD-10-CM

## 2022-04-21 DIAGNOSIS — D509 Iron deficiency anemia, unspecified: Secondary | ICD-10-CM

## 2022-04-21 DIAGNOSIS — E538 Deficiency of other specified B group vitamins: Secondary | ICD-10-CM | POA: Diagnosis not present

## 2022-04-21 DIAGNOSIS — E7800 Pure hypercholesterolemia, unspecified: Secondary | ICD-10-CM

## 2022-04-21 DIAGNOSIS — R202 Paresthesia of skin: Secondary | ICD-10-CM

## 2022-04-21 LAB — B12 AND FOLATE PANEL
Folate: 7.9 ng/mL (ref >5.9)
Vitamin B-12: 426 pg/mL (ref 211–911)

## 2022-04-21 LAB — COMPREHENSIVE METABOLIC PANEL
ALT: 11 U/L (ref 0–35)
AST: 12 U/L (ref 0–37)
Albumin: 4.5 g/dL (ref 3.5–5.2)
Alkaline Phosphatase: 51 U/L (ref 39–117)
BUN: 10 mg/dL (ref 6–23)
CO2: 29 mEq/L (ref 19–32)
Calcium: 9.5 mg/dL (ref 8.4–10.5)
Chloride: 104 mEq/L (ref 96–112)
Creatinine, Ser: 0.55 mg/dL (ref 0.40–1.20)
GFR: 118.94 mL/min (ref >60.00)
Glucose, Bld: 85 mg/dL (ref 70–99)
Potassium: 3.9 mEq/L (ref 3.5–5.1)
Sodium: 139 mEq/L (ref 135–145)
Total Bilirubin: 1.2 mg/dL (ref 0.2–1.2)
Total Protein: 6.9 g/dL (ref 6.0–8.3)

## 2022-04-21 LAB — CBC
HCT: 40.5 % (ref 36.0–46.0)
Hemoglobin: 13.3 g/dL (ref 12.0–15.0)
MCHC: 32.8 g/dL (ref 30.0–36.0)
MCV: 85.6 fl (ref 78.0–100.0)
Platelets: 186 10*3/uL (ref 150.0–400.0)
RBC: 4.73 Mil/uL (ref 3.87–5.11)
RDW: 13.2 % (ref 11.5–15.5)
WBC: 5.2 10*3/uL (ref 4.0–10.5)

## 2022-04-21 LAB — LIPID PANEL
Cholesterol: 193 mg/dL (ref 0–200)
HDL: 50.6 mg/dL (ref >39.00)
LDL Cholesterol: 123 mg/dL — ABNORMAL HIGH (ref 0–99)
NonHDL: 142.6
Total CHOL/HDL Ratio: 4
Triglycerides: 96 mg/dL (ref 0.0–149.0)
VLDL: 19.2 mg/dL (ref 0.0–40.0)

## 2022-04-21 LAB — VITAMIN D 25 HYDROXY (VIT D DEFICIENCY, FRACTURES): VITD: 20.82 ng/mL — ABNORMAL LOW (ref 30.00–100.00)

## 2022-04-21 MED ORDER — VITAMIN D (ERGOCALCIFEROL) 1.25 MG (50000 UNIT) PO CAPS: 50000.0000 [IU] | ORAL_CAPSULE | ORAL | 0 refills | Status: AC

## 2022-04-21 NOTE — Progress Notes (Signed)
Established Patient Office Visit  Subjective:  Patient ID: Brittany [name], female    DOB: 1985/11/20  Age: 36 y.o. MRN: [id number]  CC:  Chief Complaint  Patient presents with  . Transitions Of Care    HPI Brittany [name] is here for a transition of care visit.  Prior provider was: Brittany Reaper, FNP   Pt is without acute concerns.  Brittany [name] placed 2017, will be due to come out 2024.  H/o HGSIL 2016, colpo after. 07/16/20 negative pap  10/09/2018 negative pap with BV  Filed Weights   04/21/22 1130  Weight: 156 lb 4 oz (70.9 kg)    chronic concerns:  Sleep disorder: 25-50 mg trazodone at night.   Past Medical History:  Diagnosis Date  . Carcinoma in situ of uterine cervix    seen by Dr Andrey Farmer of gyn onc.  rec. conization after delivery    Past Surgical History:  Procedure Laterality Date  . CERVICAL CONIZATION W/BX N/A 12/02/2015   Procedure: CONIZATION CERVIX WITH BIOPSY;  Surgeon: Sherian Rein, MD;  Location: [facility];  Service: Gynecology;  Laterality: N/A;  . NO PAST SURGERIES      Family History  Problem Relation Age of Onset  . Diabetes Mother   . Heart failure Mother   . Alcohol abuse Maternal Grandmother   . Arthritis Neg Hx   . Asthma Neg Hx   . Birth defects Neg Hx   . Cancer Neg Hx   . COPD Neg Hx   . Depression Neg Hx   . Drug abuse Neg Hx   . Early death Neg Hx   . Hearing loss Neg Hx   . Heart disease Neg Hx   . Hyperlipidemia Neg Hx   . Hypertension Neg Hx   . Kidney disease Neg Hx   . Learning disabilities Neg Hx   . Mental illness Neg Hx   . Mental retardation Neg Hx   . Miscarriages / Stillbirths Neg Hx   . Stroke Neg Hx   . Vision loss Neg Hx   . Varicose Veins Neg Hx     Social History   Socioeconomic History  . Marital status: Married    Spouse name: Not on file  . Number of children: Not on file  . Years of education: Not on file  . Highest education level: Not on file  Occupational History  . Not on file   Tobacco Use  . Smoking status: Never  . Smokeless tobacco: Never  Vaping Use  . Vaping Use: Never used  Substance and Sexual Activity  . Alcohol use: No  . Drug use: No  . Sexual activity: Yes    Birth control/protection: I.U.D.  Other Topics Concern  . Not on file  Social History Narrative  . Not on file   Social Determinants of Health   Financial Resource Strain: Not on file  Food Insecurity: Not on file  Transportation Needs: Not on file  Physical Activity: Not on file  Stress: Not on file  Social Connections: Not on file  Intimate Partner Violence: Not on file    Outpatient Medications Prior to Visit  Medication Sig Dispense Refill  . Cholecalciferol (VITAMIN D) 50 MCG (2000 UT) CAPS Take 2,000 Units by mouth daily.    . Cobalamin Combinations (B-12) 1000-400 MCG SUBL Place 1,000 Units under the tongue daily.    Brittany [name] levonorgestrel (Brittany) 20 MCG/24HR [name] Inserted 2017    . traZODone (DESYREL) 50 MG tablet TAKE  0.5-1 TABLETS (25-50 MG TOTAL) BY MOUTH AT BEDTIME AS NEEDED FOR SLEEP. 90 tablet 0   No facility-administered medications prior to visit.    No Known Allergies  ROS Review of Systems  Review of Systems  Respiratory:  Negative for shortness of breath.   Cardiovascular:  Negative for chest pain and palpitations.  Gastrointestinal:  Negative for constipation and diarrhea.  Genitourinary:  Negative for dysuria, frequency and urgency.  Musculoskeletal:  Negative for myalgias.  Psychiatric/Behavioral:  Negative for depression and suicidal ideas.   All other systems reviewed and are negative.    Objective:    Physical Exam  Gen: NAD, resting comfortably CV: RRR with no murmurs appreciated Pulm: NWOB, CTAB with no crackles, wheezes, or rhonchi Skin: warm, dry Psych: Normal affect and thought content  BP 122/74   Pulse 64   Temp 98.7 F (37.1 C)   Resp 16   Ht 5' 4.75" (1.645 m)   Wt 156 lb 4 oz (70.9 kg)   SpO2 98%   BMI 26.20 kg/m  Wt  Readings from Last 3 Encounters:  04/21/22 156 lb 4 oz (70.9 kg)  03/04/22 151 lb 4 oz (68.6 kg)  06/17/21 152 lb (68.9 kg)     There are no preventive care reminders to display for this patient.  There are no preventive care reminders to display for this patient.  Lab Results  Component Value Date   TSH 1.62 06/17/2021   Lab Results  Component Value Date   WBC 6.2 06/17/2021   HGB 12.8 06/17/2021   HCT 38.5 06/17/2021   MCV 83.3 06/17/2021   PLT 191.0 06/17/2021   Lab Results  Component Value Date   NA 139 06/17/2021   K 3.9 06/17/2021   CO2 28 06/17/2021   GLUCOSE 80 06/17/2021   BUN 9 06/17/2021   CREATININE 0.54 06/17/2021   BILITOT 0.7 06/17/2021   ALKPHOS 64 06/17/2021   AST 12 06/17/2021   ALT 13 06/17/2021   PROT 6.7 06/17/2021   ALBUMIN 4.2 06/17/2021   CALCIUM 9.1 06/17/2021   ANIONGAP 4 (L) 10/04/2014   GFR 120.18 06/17/2021   Lab Results  Component Value Date   CHOL 165 11/10/2020   Lab Results  Component Value Date   HDL 42.70 11/10/2020   Lab Results  Component Value Date   LDLCALC 102 (H) 11/10/2020   Lab Results  Component Value Date   TRIG 102.0 11/10/2020   Lab Results  Component Value Date   CHOLHDL 4 11/10/2020   Lab Results  Component Value Date   HGBA1C 5.1 11/10/2020      Assessment & Plan:   Problem List Items Addressed This Visit   None   No orders of the defined types were placed in this encounter.   Follow-up: No follow-ups on file.    Mort Sawyers, FNP

## 2022-04-21 NOTE — Patient Instructions (Signed)
  Welcome to our clinic, I am happy to have you as my new patient. I am excited to continue on this healthcare journey with you.  Stop by the lab prior to leaving today. I will notify you of your results once received.   Please keep in mind Any my chart messages you send have up to a three business day turnaround for a response.  Phone calls may take up to a one full business day turnaround for a  response.   If you need a medication refill I recommend you request it through the pharmacy as this is easiest for us rather than sending a message and or phone call.   Due to recent changes in healthcare laws, you may see results of your imaging and/or laboratory studies on MyChart before I have had a chance to review them.  I understand that in some cases there may be results that are confusing or concerning to you. Please understand that not all results are received at the same time and often I may need to interpret multiple results in order to provide you with the best plan of care or course of treatment. Therefore, I ask that you please give me 2 business days to thoroughly review all your results before contacting my office for clarification. Should we see a critical lab result, you will be contacted sooner.   It was a pleasure seeing you today! Please do not hesitate to reach out with any questions and or concerns.  Regards,   Sharaya Boruff FNP-C  

## 2022-04-22 DIAGNOSIS — E559 Vitamin D deficiency, unspecified: Secondary | ICD-10-CM | POA: Insufficient documentation

## 2022-04-22 DIAGNOSIS — E7800 Pure hypercholesterolemia, unspecified: Secondary | ICD-10-CM | POA: Insufficient documentation

## 2022-04-22 DIAGNOSIS — E538 Deficiency of other specified B group vitamins: Secondary | ICD-10-CM | POA: Insufficient documentation

## 2022-04-22 NOTE — Assessment & Plan Note (Signed)
Ordered vitamin d pending results.   

## 2022-04-22 NOTE — Assessment & Plan Note (Signed)
Ordering cbc pending results ?

## 2022-04-22 NOTE — Assessment & Plan Note (Signed)
Ordered b12 pending results  

## 2022-04-22 NOTE — Assessment & Plan Note (Signed)
Ordering b12 pending results 

## 2022-04-22 NOTE — Assessment & Plan Note (Signed)
Ordered lipid panel, pending results. Work on low cholesterol diet and exercise as tolerated ? ?

## 2022-04-22 NOTE — Assessment & Plan Note (Signed)
Trazodone nightly.  Work on Physiological scientist.

## 2022-04-22 NOTE — Assessment & Plan Note (Signed)
Fatigue lab work up pending results 

## 2022-07-14 ENCOUNTER — Encounter: Payer: Self-pay | Admitting: Family

## 2022-07-14 ENCOUNTER — Ambulatory Visit: Payer: BC Managed Care – PPO | Admitting: Family

## 2022-07-14 VITALS — BP 132/62 | HR 78 | Temp 98.2°F | Resp 16 | Ht 64.75 in | Wt 155.0 lb

## 2022-07-14 DIAGNOSIS — E559 Vitamin D deficiency, unspecified: Secondary | ICD-10-CM | POA: Diagnosis not present

## 2022-07-14 DIAGNOSIS — J302 Other seasonal allergic rhinitis: Secondary | ICD-10-CM

## 2022-07-14 LAB — VITAMIN D 25 HYDROXY (VIT D DEFICIENCY, FRACTURES): VITD: 40.32 ng/mL (ref 30.00–100.00)

## 2022-07-14 NOTE — Assessment & Plan Note (Signed)
Ordered vitamin d pending results.   

## 2022-07-14 NOTE — Assessment & Plan Note (Signed)
Suspected allergy to cats, will test for clarity via blood. Did advise pt that would be more specific with allergist, we will order blood for now. Did suggest daily flonase 50 mcg and nightly zyrtec 10 mg

## 2022-07-14 NOTE — Patient Instructions (Signed)
Start daily flonase and nightly zyrtec.    Regards,   Mort Sawyers FNP-C

## 2022-07-14 NOTE — Progress Notes (Signed)
Established Patient Office Visit  Subjective:  Patient ID: Brittany [name], female    DOB: [birth year]  Age: 36 y.o. MRN: [id number]  CC:  Chief Complaint  Patient presents with   vitamin d    Follow up after finishing medication    HPI Brittany [name] is here today for follow up.   Vitamin d def: here to repeat levels. Completed high dose vitamin D, on vitamin D3 2000 IU once daily otc.  Last vitamin D Lab Results  Component Value Date   VD25OH 20.82 (L) 04/21/2022   Recently got some cats in her home, as kittens, and since bringing them into the house she is having more allergy type symptoms. She has more of a dry cough and feels harder to breath out of nose, and she is curious if maybe she is allergic to them. She bought flonase and started this a few times, but very recent start. She also has zyrtec at home but has not started taking it yet. Has had kittens since April, symptoms started around may. Cats do not sleep in her room.     Past Medical History:  Diagnosis Date   Carcinoma in situ of uterine cervix    seen by Dr Andrey Farmer of gyn onc.  rec. conization after delivery    Past Surgical History:  Procedure Laterality Date   CERVICAL CONIZATION W/BX N/A 12/02/2015   Procedure: CONIZATION CERVIX WITH BIOPSY;  Surgeon: Sherian Rein, MD;  Location: [facility];  Service: Gynecology;  Laterality: N/A;    Family History  Problem Relation Age of Onset   Diabetes Mother    Heart failure Mother    Healthy Father    Alcohol abuse Maternal Grandmother    Arthritis Neg Hx    Asthma Neg Hx    Birth defects Neg Hx    Cancer Neg Hx    COPD Neg Hx    Depression Neg Hx    Drug abuse Neg Hx    Early death Neg Hx    Hearing loss Neg Hx    Heart disease Neg Hx    Hyperlipidemia Neg Hx    Hypertension Neg Hx    Kidney disease Neg Hx    Learning disabilities Neg Hx    Mental illness Neg Hx    Mental retardation Neg Hx    Miscarriages / Stillbirths Neg Hx    Stroke Neg Hx     Vision loss Neg Hx    Varicose Veins Neg Hx     Social History   Socioeconomic History   Marital status: Married    Spouse name: Not on file   Number of children: 2   Years of education: Not on file   Highest education level: Not on file  Occupational History    Employer: [employer]    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  Tobacco Use   Smoking status: Never   Smokeless tobacco: Never  Vaping Use   Vaping Use: Never used  Substance and Sexual Activity   Alcohol use: No   Drug use: No   Sexual activity: Yes    Partners: Male    Birth control/protection: I.U.D.  Other Topics Concern   Not on file  Social History Narrative   Two boys, 72 and 6   Social Determinants of Health   Financial Resource Strain: Not on file  Food Insecurity: Not on file  Transportation Needs: Not on file  Physical Activity: Not  on file  Stress: Not on file  Social Connections: Not on file  Intimate Partner Violence: Not on file    Outpatient Medications Prior to Visit  Medication Sig Dispense Refill   Cholecalciferol (VITAMIN D) 50 MCG (2000 UT) CAPS Take 2,000 Units by mouth daily.     Cobalamin Combinations (B-12) 1000-400 MCG SUBL Place 1,000 Units under the tongue daily.     levonorgestrel (MIRENA) 20 MCG/24HR IUD Inserted 2017     traZODone (DESYREL) 50 MG tablet TAKE 0.5-1 TABLETS (25-50 MG TOTAL) BY MOUTH AT BEDTIME AS NEEDED FOR SLEEP. 90 tablet 0   No facility-administered medications prior to visit.    No Known Allergies       Objective:    Physical Exam Constitutional:      General: She is not in acute distress.    Appearance: Normal appearance. She is normal weight. She is not ill-appearing, toxic-appearing or diaphoretic.  HENT:     Right Ear: Tympanic membrane normal.     Left Ear: Tympanic membrane normal.     Nose: Nose normal. No congestion or rhinorrhea.     Right Turbinates: Enlarged and swollen.     Left Turbinates: Enlarged  and swollen.     Right Sinus: No maxillary sinus tenderness or frontal sinus tenderness.     Left Sinus: No maxillary sinus tenderness or frontal sinus tenderness.     Mouth/Throat:     Mouth: Mucous membranes are moist.     Pharynx: Posterior oropharyngeal erythema (cobblestoning) present. No pharyngeal swelling or oropharyngeal exudate.     Tonsils: No tonsillar exudate.  Eyes:     Extraocular Movements: Extraocular movements intact.     Conjunctiva/sclera: Conjunctivae normal.     Pupils: Pupils are equal, round, and reactive to light.  Neck:     Thyroid: No thyroid mass.  Cardiovascular:     Rate and Rhythm: Normal rate and regular rhythm.  Pulmonary:     Effort: Pulmonary effort is normal.     Breath sounds: Normal breath sounds.  Lymphadenopathy:     Cervical:     Right cervical: No superficial cervical adenopathy.    Left cervical: No superficial cervical adenopathy.  Neurological:     Mental Status: She is alert.     BP 132/62   Pulse 78   Temp 98.2 F (36.8 C)   Resp 16   Ht 5' 4.75" (1.645 m)   Wt 155 lb (70.3 kg)   SpO2 99%   BMI 25.99 kg/m  Wt Readings from Last 3 Encounters:  07/14/22 155 lb (70.3 kg)  04/21/22 156 lb 4 oz (70.9 kg)  03/04/22 151 lb 4 oz (68.6 kg)     There are no preventive care reminders to display for this patient.  There are no preventive care reminders to display for this patient.  Lab Results  Component Value Date   TSH 1.62 06/17/2021   Lab Results  Component Value Date   WBC 5.2 04/21/2022   HGB 13.3 04/21/2022   HCT 40.5 04/21/2022   MCV 85.6 04/21/2022   PLT 186.0 04/21/2022   Lab Results  Component Value Date   NA 139 04/21/2022   K 3.9 04/21/2022   CO2 29 04/21/2022   GLUCOSE 85 04/21/2022   BUN 10 04/21/2022   CREATININE 0.55 04/21/2022   BILITOT 1.2 04/21/2022   ALKPHOS 51 04/21/2022   AST 12 04/21/2022   ALT 11 04/21/2022   PROT 6.9 04/21/2022   ALBUMIN 4.5 04/21/2022  CALCIUM 9.5 04/21/2022    ANIONGAP 4 (L) 10/04/2014   GFR 118.94 04/21/2022   Lab Results  Component Value Date   CHOL 193 04/21/2022   Lab Results  Component Value Date   HDL 50.60 04/21/2022   Lab Results  Component Value Date   LDLCALC 123 (H) 04/21/2022   Lab Results  Component Value Date   TRIG 96.0 04/21/2022   Lab Results  Component Value Date   CHOLHDL 4 04/21/2022   Lab Results  Component Value Date   HGBA1C 5.1 11/10/2020      Assessment & Plan:   Problem List Items Addressed This Visit       Respiratory   Seasonal allergic rhinitis    Suspected allergy to cats, will test for clarity via blood. Did advise pt that would be more specific with allergist, we will order blood for now. Did suggest daily flonase 50 mcg and nightly zyrtec 10 mg      Relevant Orders   Allergy Panel, [facility]     Other   Vitamin D deficiency - Primary    Ordered vitamin d pending results.        Relevant Orders   VITAMIN D 25 Hydroxy (Vit-D Deficiency, Fractures)    No orders of the defined types were placed in this encounter.   Follow-up: Return in about 1 year (around 07/15/2023) for f/u CPE.    Mort Sawyers, FNP

## 2022-07-16 LAB — ALLERGY PANEL, ANIMAL GROUP
Allergen, Chicken feather, e91: <0.1 kU/L
Allergen, Mouse Urine Protein, e78: <0.1 kU/L
Allergen,Goose feathers, e70: <0.1 kU/L
CLASS: 0
CLASS: 0
CLASS: 0
CLASS: 0
Class: 0
Cow Dander IgE: <0.1 kU/L
Horse dander: <0.1 kU/L

## 2022-07-16 LAB — INTERPRETATION:

## 2022-07-22 ENCOUNTER — Encounter: Payer: Self-pay | Admitting: Radiology

## 2022-07-22 ENCOUNTER — Ambulatory Visit: Payer: BC Managed Care – PPO | Admitting: Radiology

## 2022-07-22 VITALS — BP 110/76 | Ht 64.5 in | Wt 151.0 lb

## 2022-07-22 DIAGNOSIS — Z30431 Encounter for routine checking of intrauterine contraceptive device: Secondary | ICD-10-CM

## 2022-07-22 DIAGNOSIS — Z01419 Encounter for gynecological examination (general) (routine) without abnormal findings: Secondary | ICD-10-CM

## 2022-07-22 NOTE — Progress Notes (Signed)
   Brittany [name] 05/11/1986 [id number]   History:  36 y.o. G2P2 presents for annual exam as a new patient. Desires early Mirena replacement, periods returned earlier this year. No other gyn concerns.  Gynecologic History Patient's last menstrual period was 07/11/2022 (exact date). Period Pattern: (!) Irregular Contraception/Family planning: IUD Sexually active: yes Last Pap: 2021. Results were: normal, 2020, normal  Obstetric History OB History  Gravida Para Term Preterm AB Living  2 2 2     2   SAB IAB Ectopic Multiple Live Births        0 2    # Outcome Date GA Lbr Len/2nd Weight Sex Delivery Anes PTL Lv  2 Term 10/03/15 [redacted]w[redacted]d 09:19 / 00:17 6 lb 0.6 oz (2.739 kg) M Vag-Spont EPI  LIV  1 Term 2011 [redacted]w[redacted]d  6 lb 15 oz (3.147 kg) M Vag-Spont EPI  LIV     The following portions of the patient's history were reviewed and updated as appropriate: allergies, current medications, past family history, past medical history, past social history, past surgical history, and problem list.  Review of Systems Pertinent items noted in HPI and remainder of comprehensive ROS otherwise negative.   Past medical history, past surgical history, family history and social history were all reviewed and documented in the EPIC chart.   Exam:  Vitals:   07/22/22 0937  BP: 110/76  Weight: 151 lb (68.5 kg)  Height: 5' 4.5" (1.638 m)   Body mass index is 25.52 kg/m.  General appearance:  Normal Thyroid:  Symmetrical, normal in size, without palpable masses or nodularity. Respiratory  Auscultation:  Clear without wheezing or rhonchi Cardiovascular  Auscultation:  Regular rate, without rubs, murmurs or gallops  Edema/varicosities:  Not grossly evident Abdominal  Soft,nontender, without masses, guarding or rebound.  Liver/spleen:  No organomegaly noted  Hernia:  None appreciated  Skin  Inspection:  Grossly normal Breasts: Examined lying and sitting.   Right: Without masses, retractions, nipple  discharge or axillary adenopathy.   Left: Without masses, retractions, nipple discharge or axillary adenopathy. Genitourinary   Inguinal/mons:  Normal without inguinal adenopathy  External genitalia:  Normal appearing vulva with no masses, tenderness, or lesions  BUS/Urethra/Skene's glands:  Normal without masses or exudate  Vagina:  Normal appearing with normal color and discharge, no lesions  Cervix:  Normal appearing without discharge or lesions, no strings seen  Uterus:  Normal in size, shape and contour.  Mobile, nontender  Adnexa/parametria:     Rt: Normal in size, without masses or tenderness.   Lt: Normal in size, without masses or tenderness.  Anus and perineum: Normal   Patient informed chaperone available to be present for breast and pelvic exam. Patient has requested no chaperone to be present. Patient has been advised what will be completed during breast and pelvic exam.   Assessment/Plan:   1. Well woman exam with routine gynecological exam Pap due 2024  2. Encounter for management of intrauterine contraceptive device (IUD), unspecified IUD management type Desires IUD replacement due to irregular bleeding returning a few months ago Ibuprofen 800mg  2 hours before procedure Not strings seen on exam, explained removal may be more difficult - IUD Insertion; Future     Discussed SBE, colonoscopy and DEXA screening as directed/appropriate. Recommend 2025 of exercise weekly, including weight bearing exercise. Encouraged the use of seatbelts and sunscreen. Return in 1 year for annual or as needed.   B WHNP-BC 10:05 AM 07/22/2022

## 2022-08-27 ENCOUNTER — Encounter: Payer: Self-pay | Admitting: Radiology

## 2022-08-27 ENCOUNTER — Ambulatory Visit (INDEPENDENT_AMBULATORY_CARE_PROVIDER_SITE_OTHER): Payer: BC Managed Care – PPO | Admitting: Radiology

## 2022-08-27 VITALS — BP 120/64

## 2022-08-27 DIAGNOSIS — Z113 Encounter for screening for infections with a predominantly sexual mode of transmission: Secondary | ICD-10-CM | POA: Diagnosis not present

## 2022-08-27 DIAGNOSIS — Z30431 Encounter for routine checking of intrauterine contraceptive device: Secondary | ICD-10-CM

## 2022-08-27 DIAGNOSIS — Z30433 Encounter for removal and reinsertion of intrauterine contraceptive device: Secondary | ICD-10-CM

## 2022-08-27 MED ORDER — LEVONORGESTREL 20 MCG/DAY IU IUD: 1.0000 | INTRAUTERINE_SYSTEM | Freq: Once | INTRAUTERINE | Status: AC

## 2022-08-27 NOTE — Progress Notes (Signed)
   Brittany [name] Aug 25, 1986 [id number]   History:  36 y.o. G2P2 presents for replacement of Mirena IUD.  Pt has been counseled about risks and benefits as well as complications.  Consent is obtained today.  No LMP recorded. (Menstrual status: IUD). GC/CT/Trich testing: obtained  Past medical history, past surgical history, family history and social history were all reviewed and documented in the EPIC chart.  ROS:  A ROS was performed and pertinent positives and negatives are included.  Exam: Vitals:   08/27/22 1028  BP: 120/64   There is no height or weight on file to calculate BMI.  Pelvic exam: Vulva:  normal female genitalia Vagina:  normal vagina, no discharge, exudate, lesion, or erythema Cervix:  Non-tender, Negative CMT, no lesions or redness. Uterus:  normal shape, position and consistency    Procedure:  Speculum inserted.   Cervix visualized and cleansed with Betadine x 3.  Tenaculum placed on anterior cervix. IUD strings were not visualized, Mathieu IUD forceps were used to locate strings and remove the IUD. The uterus then sounded to 8 cm. IUD inserted easily. Strings trimmed to 3 cm.  Minimal bleeding noted.  Pt tolerated the procedure well.  Chaperone present: Raynelle Fanning, CMA   Assessment/Plan:  1. Encounter for management of intrauterine contraceptive device (IUD), replacement IUD management type - IUD Insertion - Mirena  2. Routine screening for STI (sexually transmitted infection)  - SURESWAB CT/NG/T. vaginalis    Return for recheck 4-6 weeks Pt aware to call for any concerns Pt aware removal due no later than 08/27/2030,   Kabrina Christiano B WHNP-BC, 10:42 AM 08/27/2022

## 2022-08-28 LAB — SURESWAB CT/NG/T. VAGINALIS
C. trachomatis RNA, TMA: NOT DETECTED
N. gonorrhoeae RNA, TMA: NOT DETECTED
Trichomonas vaginalis RNA: NOT DETECTED

## 2022-10-20 ENCOUNTER — Ambulatory Visit: Payer: BC Managed Care – PPO | Admitting: Radiology

## 2022-11-03 ENCOUNTER — Ambulatory Visit: Payer: BC Managed Care – PPO | Admitting: Radiology

## 2022-11-03 VITALS — BP 110/70

## 2022-11-03 DIAGNOSIS — Z30431 Encounter for routine checking of intrauterine contraceptive device: Secondary | ICD-10-CM | POA: Diagnosis not present

## 2022-11-03 NOTE — Progress Notes (Signed)
     History:  37 y.o. [id number] here today for today for IUD string check; Mirena IUD was placed  08/27/22. No complaints about the IUD, no concerning side effects.  The following portions of the patient's history were reviewed and updated as appropriate: allergies, current medications, past family history, past medical history, past social history, past surgical history and problem list. Last pap smear on 07/16/22 was normal, negative HR HPV.  Review of Systems:  Pertinent items are noted in HPI.   Objective:  Physical Exam Blood pressure 110/70. Gen: NAD Abd: Soft, nontender and nondistended Pelvic: Normal appearing external genitalia; normal appearing vaginal mucosa and cervix.  IUD strings visualized, about 3 cm in length outside cervix.  Chaperone offered and declined.  Assessment & Plan:  Normal IUD check. Patient may keep IUD in place for up to 8 years. May remover sooner  if she desires pregnancy, or has side effects within that time.   Rubbie Battiest, [facility]

## 2022-12-15 ENCOUNTER — Encounter: Payer: Self-pay | Admitting: Family Medicine

## 2022-12-15 ENCOUNTER — Ambulatory Visit: Payer: BC Managed Care – PPO | Admitting: Family Medicine

## 2022-12-15 VITALS — BP 112/68 | HR 83 | Temp 97.8°F | Ht 64.5 in | Wt 155.4 lb

## 2022-12-15 DIAGNOSIS — J019 Acute sinusitis, unspecified: Secondary | ICD-10-CM | POA: Insufficient documentation

## 2022-12-15 DIAGNOSIS — J301 Allergic rhinitis due to pollen: Secondary | ICD-10-CM | POA: Diagnosis not present

## 2022-12-15 DIAGNOSIS — J0110 Acute frontal sinusitis, unspecified: Secondary | ICD-10-CM | POA: Diagnosis not present

## 2022-12-15 DIAGNOSIS — R0989 Other specified symptoms and signs involving the circulatory and respiratory systems: Secondary | ICD-10-CM | POA: Diagnosis not present

## 2022-12-15 LAB — POC COVID19 BINAXNOW: SARS Coronavirus 2 Ag: NEGATIVE

## 2022-12-15 MED ORDER — AMOXICILLIN-POT CLAVULANATE 875-125 MG PO TABS: 1.0000 | ORAL_TABLET | Freq: Two times a day (BID) | ORAL | 0 refills | Status: DC

## 2022-12-15 NOTE — Assessment & Plan Note (Signed)
Enc her to get back on zyrtec and flonase in season   She also uses pataday eye drops

## 2022-12-15 NOTE — Patient Instructions (Signed)
Get back on your flonase and zyrtec   Take augmentin as directed for bacterial sinus infection   Nasal saline spray and breathing steam both help congestion also   Update if not starting to improve in a week or if worsening

## 2022-12-15 NOTE — Progress Notes (Signed)
Subjective:    Patient ID: Brittany [name], female    DOB: 07/03/1986, 37 y.o.   MRN: [id number]  HPI 37 yo pt of NP Dugal presents with sinus congestion   Wt Readings from Last 3 Encounters:  12/15/22 155 lb 6 oz (70.5 kg)  07/22/22 151 lb (68.5 kg)  07/14/22 155 lb (70.3 kg)   26.26 kg/m  Vitals:   12/15/22 0824  BP: 112/68  Pulse: 83  Temp: 97.8 F (36.6 C)  SpO2: 97%   She has a h/o allergic rhinitis  Symptoms started about 2 weeks ago   Pnd -bothering throat  Nasal congestion  Ears pop - cannot clear them  Muffled hearing   Some headaches  Frontal/ facial pain and pressure   Eyes itch  Nasal mucous is green  Same with what she spits out   Cough= to clear throat  Not deep  No wheezing or sob   No fever   Husband has sinusitis    Meds Pataday eye drops    Flonase-not for a week Zyrtec -not for a week    Neg covid test  Results for orders placed or performed in visit on 12/15/22  POC COVID-19  Result Value Ref Range   SARS Coronavirus 2 Ag Negative Negative    Patient Active Problem List   Diagnosis Date Noted   Acute sinusitis 12/15/2022   Seasonal allergic rhinitis 07/14/2022   Elevated LDL cholesterol level 04/22/2022   Vitamin D deficiency 04/22/2022   Vitamin B12 deficiency 04/22/2022   Iron deficiency anemia during pregnancy 04/21/2022   Other fatigue 04/21/2022   Paresthesia of left upper extremity 06/17/2021   Insomnia 10/12/2019   Past Medical History:  Diagnosis Date   Carcinoma in situ of uterine cervix    seen by Dr Andrey Farmerossi of gyn onc.  rec. conization after delivery   Past Surgical History:  Procedure Laterality Date   CERVICAL CONIZATION W/BX N/A 12/02/2015   Procedure: CONIZATION CERVIX WITH BIOPSY;  Surgeon: Sherian ReinJody [name], MD;  Location: [facility];  Service: Gynecology;  Laterality: N/A;   Social History   Tobacco Use   Smoking status: Never    Passive exposure: Never   Smokeless tobacco: Never  Vaping Use    Vaping Use: Never used  Substance Use Topics   Alcohol use: No   Drug use: No   Family History  Problem Relation Age of Onset   Diabetes Mother    Heart failure Mother    Healthy Father    Alcohol abuse Maternal Grandmother    Arthritis Neg Hx    Asthma Neg Hx    Birth defects Neg Hx    Cancer Neg Hx    COPD Neg Hx    Depression Neg Hx    Drug abuse Neg Hx    Early death Neg Hx    Hearing loss Neg Hx    Heart disease Neg Hx    Hyperlipidemia Neg Hx    Hypertension Neg Hx    Kidney disease Neg Hx    Learning disabilities Neg Hx    Mental illness Neg Hx    Mental retardation Neg Hx    Miscarriages / Stillbirths Neg Hx    Stroke Neg Hx    Vision loss Neg Hx    Varicose Veins Neg Hx    No Known Allergies Current Outpatient Medications on File Prior to Visit  Medication Sig Dispense Refill   cetirizine (ZYRTEC) 10 MG tablet Take 10 mg by  mouth daily.     Cholecalciferol (VITAMIN D) 50 MCG (2000 UT) CAPS Take 2,000 Units by mouth daily.     Cobalamin Combinations (B-12) 1000-400 MCG SUBL Place 1,000 Units under the tongue daily.     fluticasone (FLONASE) 50 MCG/ACT nasal spray Place 2 sprays into both nostrils daily.     olopatadine (PATANOL) 0.1 % ophthalmic solution 1 drop 2 (two) times daily.     traZODone (DESYREL) 50 MG tablet TAKE 0.5-1 TABLETS (25-50 MG TOTAL) BY MOUTH AT BEDTIME AS NEEDED FOR SLEEP. 90 tablet 0   Current Facility-Administered Medications on File Prior to Visit  Medication Dose Route Frequency Provider Last Rate Last Admin   levonorgestrel (MIRENA) 20 MCG/DAY IUD 1 each  1 each Intrauterine Once Chrzanowski, Jami B, NP        Review of Systems  Constitutional:  Positive for appetite change and fatigue. Negative for fever.  HENT:  Positive for congestion, ear pain, postnasal drip, rhinorrhea, sinus pressure and sore throat. Negative for nosebleeds.   Eyes:  Negative for pain, redness and itching.  Respiratory:  Positive for cough. Negative for  shortness of breath and wheezing.   Cardiovascular:  Negative for chest pain.  Gastrointestinal:  Negative for abdominal pain, diarrhea, nausea and vomiting.  Endocrine: Negative for polyuria.  Genitourinary:  Negative for dysuria, frequency and urgency.  Musculoskeletal:  Negative for arthralgias and myalgias.  Allergic/Immunologic: Negative for immunocompromised state.  Neurological:  Positive for headaches. Negative for dizziness, tremors, syncope, weakness and numbness.  Hematological:  Negative for adenopathy. Does not bruise/bleed easily.  Psychiatric/Behavioral:  Negative for dysphoric mood. The patient is not nervous/anxious.        Recent stress Husband had appendicitis        Objective:   Physical Exam Constitutional:      General: She is not in acute distress.    Appearance: Normal appearance. She is well-developed and normal weight. She is not ill-appearing.  HENT:     Head: Normocephalic and atraumatic.     Comments: Frontal sinuses are more tender than maxillary  Worse on the left     Right Ear: Tympanic membrane and external ear normal.     Left Ear: Tympanic membrane and external ear normal.     Nose: Congestion and rhinorrhea present.     Comments: Nares are boggy and congested     Mouth/Throat:     Pharynx: Oropharynx is clear. No oropharyngeal exudate or posterior oropharyngeal erythema.     Comments: Clear pnd Eyes:     General:        Right eye: No discharge.        Left eye: No discharge.     Conjunctiva/sclera: Conjunctivae normal.     Pupils: Pupils are equal, round, and reactive to light.  Cardiovascular:     Rate and Rhythm: Normal rate and regular rhythm.  Pulmonary:     Effort: Pulmonary effort is normal. No respiratory distress.     Breath sounds: Normal breath sounds. No stridor. No wheezing, rhonchi or rales.     Comments: Good air exch No rales or rhonchi Musculoskeletal:     Cervical back: Normal range of motion and neck supple.   Lymphadenopathy:     Cervical: No cervical adenopathy.  Skin:    General: Skin is warm and dry.     Findings: No rash.  Neurological:     Mental Status: She is alert.     Cranial Nerves: No cranial nerve deficit.  Coordination: Coordination normal.  Psychiatric:        Mood and Affect: Mood normal.           Assessment & Plan:   Problem List Items Addressed This Visit       Respiratory   Acute sinusitis - Primary    2 weeks of sinus pressure and pain / with green nasal mucous  Has baseline all rhinitis but cannot r/o viral uri to start (husband had it)  Will tx with augmentin  Disc sympt care-see AVS Will get back on allergy medicines -zyrtec and flonase  ER precautions noted Update if not starting to improve in a week or if worsening        Relevant Medications   amoxicillin-clavulanate (AUGMENTIN) 875-125 MG tablet   cetirizine (ZYRTEC) 10 MG tablet   fluticasone (FLONASE) 50 MCG/ACT nasal spray   Seasonal allergic rhinitis    Enc her to get back on zyrtec and flonase in season   She also uses pataday eye drops       Other Visit Diagnoses     Chest congestion       Relevant Orders   POC COVID-19 (Completed)

## 2022-12-15 NOTE — Assessment & Plan Note (Signed)
2 weeks of sinus pressure and pain / with green nasal mucous  Has baseline all rhinitis but cannot r/o viral uri to start (husband had it)  Will tx with augmentin  Disc sympt care-see AVS Will get back on allergy medicines -zyrtec and flonase  ER precautions noted Update if not starting to improve in a week or if worsening

## 2023-01-04 ENCOUNTER — Ambulatory Visit: Payer: BC Managed Care – PPO | Admitting: Family

## 2023-01-04 ENCOUNTER — Encounter: Payer: Self-pay | Admitting: Family

## 2023-01-04 VITALS — BP 120/72 | HR 74 | Temp 98.1°F | Ht 64.5 in | Wt 156.4 lb

## 2023-01-04 DIAGNOSIS — J029 Acute pharyngitis, unspecified: Secondary | ICD-10-CM

## 2023-01-04 DIAGNOSIS — H9203 Otalgia, bilateral: Secondary | ICD-10-CM | POA: Diagnosis not present

## 2023-01-04 DIAGNOSIS — J020 Streptococcal pharyngitis: Secondary | ICD-10-CM | POA: Insufficient documentation

## 2023-01-04 LAB — POCT RAPID STREP A (OFFICE): Rapid Strep A Screen: POSITIVE — AB

## 2023-01-04 MED ORDER — PREDNISONE 10 MG (21) PO TBPK
ORAL_TABLET | ORAL | 0 refills | Status: DC
Start: 2023-01-04 — End: 2023-09-05

## 2023-01-04 MED ORDER — CEPHALEXIN 500 MG PO CAPS
500.0000 mg | ORAL_CAPSULE | Freq: Two times a day (BID) | ORAL | 0 refills | Status: AC
Start: 2023-01-04 — End: 2023-01-14

## 2023-01-04 NOTE — Progress Notes (Signed)
Established Patient Office Visit  Subjective:   Patient ID: Brittany [name], female    DOB: [birth year]  Age: 37 y.o. MRN: [id number]  CC:  Chief Complaint  Patient presents with   Sore Throat    HPI: Brittany [name] is a 37 y.o. female presenting on 01/04/2023 for Sore Throat   Sore Throat     A few weeks ago 4/20 came into the office and saw Dr. Milinda Antis for diagnosis of acute sinusitis. Given and completed augmentin bid x 10 days. She was recommended to start zyrtec as well as flonase.   She states started to feel better, however as soon as the antibiotic was complete she states symptoms came back. C/o sore throat, bil ear fullness, and sinus pressure and nasal congestion. Tenderness also in her neck area. No fever or chills.  No cough or chest congestion.   Was taking some cough drops just to help with the throat.  Mom has been diagnosed with strep as well and is currently being treated.         ROS: Negative unless specifically indicated above in HPI.   Relevant past medical history reviewed and updated as indicated.   Allergies and medications reviewed and updated.   Current Outpatient Medications:    cephALEXin (KEFLEX) 500 MG capsule, Take 1 capsule (500 mg total) by mouth 2 (two) times daily for 10 days., Disp: 20 capsule, Rfl: 0   cetirizine (ZYRTEC) 10 MG tablet, Take 10 mg by mouth daily., Disp: , Rfl:    Cholecalciferol (VITAMIN D) 50 MCG (2000 UT) CAPS, Take 2,000 Units by mouth daily., Disp: , Rfl:    Cobalamin Combinations (B-12) 1000-400 MCG SUBL, Place 1,000 Units under the tongue daily., Disp: , Rfl:    fluticasone (FLONASE) 50 MCG/ACT nasal spray, Place 2 sprays into both nostrils daily., Disp: , Rfl:    olopatadine (PATANOL) 0.1 % ophthalmic solution, 1 drop 2 (two) times daily., Disp: , Rfl:    predniSONE (STERAPRED UNI-PAK 21 TAB) 10 MG (21) TBPK tablet, Take as directed, Disp: 1 each, Rfl: 0   traZODone (DESYREL) 50 MG tablet, TAKE 0.5-1 TABLETS  (25-50 MG TOTAL) BY MOUTH AT BEDTIME AS NEEDED FOR SLEEP., Disp: 90 tablet, Rfl: 0  Current Facility-Administered Medications:    levonorgestrel (MIRENA) 20 MCG/DAY IUD 1 each, 1 each, Intrauterine, Once, Chrzanowski, Jami B, NP  No Known Allergies  Objective:   BP 120/72   Pulse 74   Temp 98.1 F (36.7 C) (Temporal)   Ht 5' 4.5" (1.638 m)   Wt 156 lb 6.4 oz (70.9 kg)   SpO2 99%   BMI 26.43 kg/m    Physical Exam Constitutional:      General: She is not in acute distress.    Appearance: Normal appearance. She is normal weight. She is not ill-appearing, toxic-appearing or diaphoretic.  HENT:     Head: Normocephalic.     Right Ear: Tympanic membrane is erythematous (base of TM). Tympanic membrane is not bulging.     Left Ear: Tympanic membrane normal. Tympanic membrane is not bulging.     Nose: Nose normal.     Mouth/Throat:     Mouth: Mucous membranes are dry.     Pharynx: Posterior oropharyngeal erythema present. No oropharyngeal exudate.     Tonsils: No tonsillar exudate. 1+ on the right. 1+ on the left.  Eyes:     Extraocular Movements: Extraocular movements intact.     Pupils: Pupils are equal, round, and reactive  to light.  Cardiovascular:     Rate and Rhythm: Normal rate and regular rhythm.     Pulses: Normal pulses.     Heart sounds: Normal heart sounds.  Pulmonary:     Effort: Pulmonary effort is normal.     Breath sounds: Normal breath sounds.  Musculoskeletal:     Cervical back: Normal range of motion.  Lymphadenopathy:     Cervical: Cervical adenopathy present.     Right cervical: Superficial cervical adenopathy present.     Left cervical: Superficial cervical adenopathy present.  Neurological:     General: No focal deficit present.     Mental Status: She is alert and oriented to person, place, and time. Mental status is at baseline.  Psychiatric:        Mood and Affect: Mood normal.        Behavior: Behavior normal.        Thought Content: Thought  content normal.        Judgment: Judgment normal.     Assessment & Plan:  Sore throat -     POCT rapid strep A  Strep pharyngitis Assessment & Plan: Treatment failure augmentin  Will tx with cephalexin 500 mg bid x 10 days  Continue flonase and zyrtec for allergies Prednisone pack for sinus pressure and ear pain  Ibuprofen/tyelnol prn sore throat/fever Pt told to F/u if no improvement in the next 2-3 days.   Orders: -     Cephalexin; Take 1 capsule (500 mg total) by mouth 2 (two) times daily for 10 days.  Dispense: 20 capsule; Refill: 0  Ear pain, bilateral -     predniSONE; Take as directed  Dispense: 1 each; Refill: 0     Follow up plan: Return if symptoms worsen or fail to improve.  Mort Sawyers, FNP

## 2023-01-04 NOTE — Patient Instructions (Signed)
------------------------------------    You were found to be strep positive,  Take antibiotics that have been sent to the pharmacy.  Change your toothbrush after 24 hours on the antibiotics.  Gargle with warm salt water as needed for sore throat.   ------------------------------------  

## 2023-01-04 NOTE — Assessment & Plan Note (Signed)
Treatment failure augmentin  Will tx with cephalexin 500 mg bid x 10 days  Continue flonase and zyrtec for allergies Prednisone pack for sinus pressure and ear pain  Ibuprofen/tyelnol prn sore throat/fever Pt told to F/u if no improvement in the next 2-3 days.

## 2023-07-08 ENCOUNTER — Telehealth: Payer: Self-pay | Admitting: Family

## 2023-07-08 ENCOUNTER — Encounter: Payer: Self-pay | Admitting: Primary Care

## 2023-07-08 ENCOUNTER — Ambulatory Visit (INDEPENDENT_AMBULATORY_CARE_PROVIDER_SITE_OTHER): Payer: BC Managed Care – PPO | Admitting: Primary Care

## 2023-07-08 VITALS — BP 110/64 | HR 100 | Temp 98.8°F | Ht 64.5 in | Wt 147.0 lb

## 2023-07-08 DIAGNOSIS — R102 Pelvic and perineal pain: Secondary | ICD-10-CM | POA: Diagnosis not present

## 2023-07-08 LAB — POC URINALSYSI DIPSTICK (AUTOMATED)
Bilirubin, UA: NEGATIVE
Blood, UA: POSITIVE
Glucose, UA: NEGATIVE
Ketones, UA: NEGATIVE
Leukocytes, UA: NEGATIVE
Nitrite, UA: NEGATIVE
Protein, UA: NEGATIVE
Spec Grav, UA: 1.015 (ref 1.010–1.025)
Urobilinogen, UA: 0.2 U/dL
pH, UA: 6 (ref 5.0–8.0)

## 2023-07-08 LAB — POCT URINE PREGNANCY: Preg Test, Ur: NEGATIVE

## 2023-07-08 NOTE — Telephone Encounter (Signed)
Noted.  Will evaluate. 

## 2023-07-08 NOTE — Assessment & Plan Note (Addendum)
Improving.  Differentials include ovarian cyst, cystitis, constipation, ectopic pregnancy.  Urinalysis with 2+ blood, otherwise negative. Culture ordered and pending.  Urine pregnancy test negative.  Discussed potential etiologies. Since her pain is improving, and urine pregnancy test is negative, we elected to hold off on a transvaginal/pelvic ultrasound. If her pain returns and/or there is no improvement in current pain, then we will proceed with transvaginal/pelvic US. She agrees.  We also discussed a trial of Miralax.  Strict ED precautions provided.

## 2023-07-08 NOTE — Telephone Encounter (Signed)
FYI: This call has been transferred to Access Nurse. Once the result note has been entered staff can address the message at that time.  Patient called in with the following symptoms:  Red Word:abdominal pain   Please advise at Mobile [phone] (mobile)  Message is routed to Provider Pool and [facility] Triage   Pt called requesting an appt for abdominal pain/discomfort. Pt didn't report any other symptoms. Scheduled pt with [facility] for today, 11/1 @ 3pm. Transferred to access nurse.

## 2023-07-08 NOTE — Telephone Encounter (Signed)
Can we call to triage patient? "Severe abdominal pain" was mentioned in the access nurse report. Just want to make sure we aren't dealing with a ruptured appendicitis or another abdominal emergency.

## 2023-07-08 NOTE — Telephone Encounter (Signed)
I spoke with pt; pt said pain started rt lower abd as a dull continuous achy pain during intercourse last night. During the night the abd pain lessened. Now pain level is 3 - 4. No N&V and no fever. No diarrhea or constipation. Pt had normal BM this morning and no blood seen. Pt has no UTI symptoms and no back pain. Pt has no hx of kidney stones. Pt does have appendix. Pt said the lower rt sided abd pain does worsen sometimes with movement. Pt is presently driving her car; pt has to take a test and the shaking and bouncing in vehicle does not worsen the rt sided abd pain. Pt said she is not in any distress at this time and wants to wait for her appt 07/08/23 at 3 pm. UC & ED precautions given and pt voiced understanding. Sending note to Allayne Gitelman NP and [employer].

## 2023-07-08 NOTE — Addendum Note (Signed)
Addended by: Alvina Chou on: 07/08/2023 03:50 PM   Modules accepted: Orders

## 2023-07-08 NOTE — Patient Instructions (Signed)
Please notify me if your symptoms worsen or do not improve.  It was a pleasure meeting you!

## 2023-07-08 NOTE — Telephone Encounter (Signed)
Pt already has appt with Allayne Gitelman NP on 07/08/23 nat 3:00 pm. Sending note to Allayne Gitelman NP and [facility] pool.

## 2023-07-08 NOTE — Progress Notes (Signed)
Subjective:    Patient ID: Brittany [name], female    DOB: 10-13-1985, 37 y.o.   MRN: [id number]  Abdominal Pain Associated symptoms include constipation. Pertinent negatives include no diarrhea, dysuria, fever, frequency, nausea or vomiting.    KAHLEN BOYDE is a very pleasant 37 y.o. female patient of Tabitha, NP with a history of iron deficiency anemia during pregnancy, fatigue, vitamin D and B12 deficiency who presents today to discuss pelvic pain.  She contacted our office this morning with reports of right pelvic pain that occurred last night during intercourse. She was asked to come in for an appointment.  She describes her pain last night as constant, sharp, and without radiation. She continued to experience pain throughout the night that kept her from sleeping much.   Today her right pelvic pain has improved but she feels an intermittent dull pain to the same location. When she passes gas she feels better. She has been experienced constipation more recently, prior to symptom onset. Her last bowel movement was today, then prior to today her last one was 2-3 days prior. She's try to work on her diet.   She denies fevers, chills, nausea, vomiting, diarrhea, vaginal bleeding, vaginal discharge, appetite changes.   Review of Systems  Constitutional:  Negative for chills and fever.  Gastrointestinal:  Positive for constipation. Negative for abdominal pain, blood in stool, diarrhea, nausea and vomiting.  Genitourinary:  Positive for pelvic pain. Negative for dysuria, frequency, vaginal bleeding and vaginal discharge.         Past Medical History:  Diagnosis Date   Carcinoma in situ of uterine cervix    seen by Dr Andrey Farmer of gyn onc.  rec. 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  Tobacco Use   Smoking status: Never    Passive exposure: Never   Smokeless tobacco: Never  Vaping Use   Vaping status: Never Used  Substance and Sexual Activity   Alcohol use: No   Drug use: No   Sexual activity: Yes    Partners: Male    Birth control/protection: I.U.D.    Comment: menarche 37yo, sexual debut 37yo  Other Topics Concern   Not on file  Social History Narrative   Two boys, 77 and 6   Social Determinants of Health   Financial Resource Strain: Not on file  Food Insecurity: Not on file  Transportation Needs: Not on file  Physical Activity: Not on file  Stress: Not on file  Social Connections: Not on file  Intimate Partner Violence: Not on file    Past Surgical History:  Procedure Laterality Date   CERVICAL CONIZATION W/BX N/A 12/02/2015   Procedure: CONIZATION CERVIX WITH BIOPSY;  Surgeon: Sherian Rein, MD;  Location: [facility];  Service: Gynecology;  Laterality: N/A;    Family History  Problem Relation Age of Onset   Diabetes Mother    Heart failure Mother    Healthy Father    Alcohol abuse Maternal Grandmother    Arthritis Neg Hx    Asthma Neg Hx    Birth defects Neg Hx    Cancer Neg Hx  COPD Neg Hx    Depression Neg Hx    Drug abuse Neg Hx    Early death Neg Hx    Hearing loss Neg Hx    Heart disease Neg Hx    Hyperlipidemia Neg Hx    Hypertension Neg Hx    Kidney disease Neg Hx    Learning disabilities Neg Hx    Mental illness Neg Hx    Mental retardation Neg Hx    Miscarriages / Stillbirths Neg Hx    Stroke Neg Hx    Vision loss Neg Hx    Varicose Veins Neg Hx     No Known Allergies  Current Outpatient Medications on File Prior to Visit  Medication Sig Dispense Refill   cetirizine (ZYRTEC) 10 MG tablet Take 10 mg by mouth daily.     Cholecalciferol (VITAMIN D) 50 MCG (2000 UT) CAPS Take 2,000 Units by mouth daily.     Cobalamin  Combinations (B-12) 1000-400 MCG SUBL Place 1,000 Units under the tongue daily.     fluticasone (FLONASE) 50 MCG/ACT nasal spray Place 2 sprays into both nostrils daily.     olopatadine (PATANOL) 0.1 % ophthalmic solution 1 drop 2 (two) times daily.     traZODone (DESYREL) 50 MG tablet TAKE 0.5-1 TABLETS (25-50 MG TOTAL) BY MOUTH AT BEDTIME AS NEEDED FOR SLEEP. 90 tablet 0   predniSONE (STERAPRED UNI-PAK 21 TAB) 10 MG (21) TBPK tablet Take as directed (Patient not taking: Reported on 07/08/2023) 1 each 0   Current Facility-Administered Medications on File Prior to Visit  Medication Dose Route Frequency Provider Last Rate Last Admin   levonorgestrel (MIRENA) 20 MCG/DAY IUD 1 each  1 each Intrauterine Once Chrzanowski, Jami B, NP        BP 110/64   Pulse 100   Temp 98.8 F (37.1 C) (Temporal)   Ht 5' 4.5" (1.638 m)   Wt 147 lb (66.7 kg)   SpO2 98%   BMI 24.84 kg/m  Objective:   Physical Exam Cardiovascular:     Rate and Rhythm: Normal rate.  Pulmonary:     Effort: Pulmonary effort is normal.  Abdominal:     General: Bowel sounds are normal.     Palpations: Abdomen is soft.     Tenderness: There is no abdominal tenderness.       Comments: Site of pain marked. Non tender on exam.  Musculoskeletal:     Cervical back: Neck supple.  Skin:    General: Skin is warm and dry.  Neurological:     Mental Status: She is alert and oriented to person, place, and time.  Psychiatric:        Mood and Affect: Mood normal.           Assessment & Plan:  Acute pelvic pain Assessment & Plan: Improving.  Differentials include ovarian cyst, cystitis, constipation, ectopic pregnancy.  Urinalysis with 2+ blood, otherwise negative. Culture ordered and pending.  Urine pregnancy test negative.  Discussed potential etiologies. Since her pain is improving, and urine pregnancy test is negative, we elected to hold off on a transvaginal/pelvic ultrasound. If her pain returns and/or there is no  improvement in current pain, then we will proceed with transvaginal/pelvic US. She agrees.  We also discussed a trial of Miralax.  Strict ED precautions provided.   Orders: -     POCT Urinalysis Dipstick (Automated) -     POCT urine pregnancy -     Urine Microscopic -  Urine Culture        Doreene Nest, NP

## 2023-07-09 LAB — URINALYSIS, MICROSCOPIC ONLY
Bacteria, UA: NONE SEEN /[HPF]
Hyaline Cast: NONE SEEN /[LPF]
WBC, UA: NONE SEEN /[HPF] (ref 0–5)

## 2023-07-09 LAB — URINE CULTURE
MICRO NUMBER:: 15675418
SPECIMEN QUALITY:: ADEQUATE

## 2023-07-29 ENCOUNTER — Encounter: Payer: BC Managed Care – PPO | Admitting: Family

## 2023-09-05 ENCOUNTER — Ambulatory Visit (INDEPENDENT_AMBULATORY_CARE_PROVIDER_SITE_OTHER): Payer: BC Managed Care – PPO | Admitting: Family

## 2023-09-05 ENCOUNTER — Other Ambulatory Visit: Payer: Self-pay | Admitting: Family

## 2023-09-05 ENCOUNTER — Encounter: Payer: Self-pay | Admitting: Family

## 2023-09-05 VITALS — BP 106/60 | HR 73 | Temp 98.4°F | Ht 64.5 in | Wt 148.6 lb

## 2023-09-05 DIAGNOSIS — E559 Vitamin D deficiency, unspecified: Secondary | ICD-10-CM | POA: Diagnosis not present

## 2023-09-05 DIAGNOSIS — E538 Deficiency of other specified B group vitamins: Secondary | ICD-10-CM

## 2023-09-05 DIAGNOSIS — Z0000 Encounter for general adult medical examination without abnormal findings: Secondary | ICD-10-CM | POA: Diagnosis not present

## 2023-09-05 DIAGNOSIS — D509 Iron deficiency anemia, unspecified: Secondary | ICD-10-CM

## 2023-09-05 DIAGNOSIS — E7800 Pure hypercholesterolemia, unspecified: Secondary | ICD-10-CM | POA: Diagnosis not present

## 2023-09-05 DIAGNOSIS — F5101 Primary insomnia: Secondary | ICD-10-CM | POA: Diagnosis not present

## 2023-09-05 DIAGNOSIS — J301 Allergic rhinitis due to pollen: Secondary | ICD-10-CM | POA: Diagnosis not present

## 2023-09-05 DIAGNOSIS — Z8639 Personal history of other endocrine, nutritional and metabolic disease: Secondary | ICD-10-CM | POA: Diagnosis not present

## 2023-09-05 LAB — BASIC METABOLIC PANEL
BUN: 12 mg/dL (ref 6–23)
CO2: 25 meq/L (ref 19–32)
Calcium: 9.2 mg/dL (ref 8.4–10.5)
Chloride: 105 meq/L (ref 96–112)
Creatinine, Ser: 0.6 mg/dL (ref 0.40–1.20)
GFR: 115.36 mL/min (ref >60.00)
Glucose, Bld: 99 mg/dL (ref 70–99)
Potassium: 3.6 meq/L (ref 3.5–5.1)
Sodium: 140 meq/L (ref 135–145)

## 2023-09-05 LAB — CBC
HCT: 38.8 % (ref 36.0–46.0)
Hemoglobin: 13 g/dL (ref 12.0–15.0)
MCHC: 33.5 g/dL (ref 30.0–36.0)
MCV: 84.1 fL (ref 78.0–100.0)
Platelets: 192 10*3/uL (ref 150.0–400.0)
RBC: 4.61 Mil/uL (ref 3.87–5.11)
RDW: 13.8 % (ref 11.5–15.5)
WBC: 6.6 10*3/uL (ref 4.0–10.5)

## 2023-09-05 LAB — LIPID PANEL
Cholesterol: 195 mg/dL (ref 0–200)
HDL: 45.3 mg/dL (ref >39.00)
LDL Cholesterol: 125 mg/dL — ABNORMAL HIGH (ref 0–99)
NonHDL: 149.24
Total CHOL/HDL Ratio: 4
Triglycerides: 122 mg/dL (ref 0.0–149.0)
VLDL: 24.4 mg/dL (ref 0.0–40.0)

## 2023-09-05 LAB — HEMOGLOBIN A1C: Hgb A1c MFr Bld: 5.4 % (ref 4.6–6.5)

## 2023-09-05 LAB — VITAMIN B12: Vitamin B-12: 479 pg/mL (ref 211–911)

## 2023-09-05 LAB — VITAMIN D 25 HYDROXY (VIT D DEFICIENCY, FRACTURES): VITD: 19.29 ng/mL — ABNORMAL LOW (ref 30.00–100.00)

## 2023-09-05 MED ORDER — TRAZODONE HCL 50 MG PO TABS: 25.0000 mg | ORAL_TABLET | Freq: Every evening | ORAL | 0 refills | Status: DC | PRN

## 2023-09-05 MED ORDER — FLUTICASONE PROPIONATE 50 MCG/ACT NA SUSP: 2.0000 | Freq: Every day | NASAL | 11 refills | Status: AC

## 2023-09-05 MED ORDER — CHOLECALCIFEROL 1.25 MG (50000 UT) PO TABS: 1.0000 | ORAL_TABLET | ORAL | 0 refills | Status: AC

## 2023-09-05 NOTE — Assessment & Plan Note (Signed)

## 2023-09-05 NOTE — Assessment & Plan Note (Signed)
Cbc today pending results 

## 2023-09-05 NOTE — Progress Notes (Signed)
Subjective:  Patient ID: Brittany [name], female    DOB: December 22, 1985  Age: 37 y.o. MRN: [id number]  Patient Care Team: Mort Sawyers, FNP as PCP - General (Family Medicine)   CC:  Chief Complaint  Patient presents with   Annual Exam    HPI Brittany [name] is a 37 y.o. female who presents today for an annual physical exam. She reports consuming a general diet. The patient does not participate in regular exercise at present. She generally feels well. She reports sleeping fairly well, she has not been taking her trazodone because she is worried it will make it hard for her to wake up, she will try on the weekends. She does not have additional problems to discuss today.   Vision:Within last year Dental:Receives regular dental care  Last pap: 07/16/20, she thinks she had one in 2023.    Pt is without acute concerns.   Wt Readings from Last 3 Encounters:  09/05/23 148 lb 9.6 oz (67.4 kg)  07/08/23 147 lb (66.7 kg)  01/04/23 156 lb 6.4 oz (70.9 kg)    Advanced Directives Patient does not have advanced directives   DEPRESSION SCREENING    07/08/2023    3:02 PM 01/04/2023    8:08 AM 12/15/2022    8:34 AM 07/14/2022    8:20 AM 11/10/2020   10:31 AM 10/12/2019    2:29 PM 10/09/2018    2:35 PM  PHQ 2/9 Scores  PHQ - 2 Score 0 0 0 0 0 0 0  PHQ- 9 Score  2 1   3 3      ROS: Negative unless specifically indicated above in HPI.    Current Outpatient Medications:    cetirizine (ZYRTEC) 10 MG tablet, Take 10 mg by mouth daily., Disp: , Rfl:    Cholecalciferol (VITAMIN D) 50 MCG (2000 UT) CAPS, Take 2,000 Units by mouth daily., Disp: , Rfl:    Cobalamin Combinations (B-12) 1000-400 MCG SUBL, Place 1,000 Units under the tongue daily., Disp: , Rfl:    olopatadine (PATANOL) 0.1 % ophthalmic solution, 1 drop 2 (two) times daily., Disp: , Rfl:    fluticasone (FLONASE) 50 MCG/ACT nasal spray, Place 2 sprays into both nostrils daily., Disp: 1 g, Rfl: 11   traZODone (DESYREL) 50 MG tablet, Take  0.5-1 tablets (25-50 mg total) by mouth at bedtime as needed for sleep., Disp: 30 tablet, Rfl: 0  Current Facility-Administered Medications:    levonorgestrel (MIRENA) 20 MCG/DAY IUD 1 each, 1 each, Intrauterine, Once, Chrzanowski, Jami B, NP    Objective:    BP 106/60 (BP Location: Left Arm, Patient Position: Sitting, Cuff Size: Normal)   Pulse 73   Temp 98.4 F (36.9 C) (Temporal)   Ht 5' 4.5" (1.638 m)   Wt 148 lb 9.6 oz (67.4 kg)   SpO2 96%   BMI 25.11 kg/m   BP Readings from Last 3 Encounters:  09/05/23 106/60  07/08/23 110/64  01/04/23 120/72      Physical Exam Constitutional:      General: She is not in acute distress.    Appearance: Normal appearance. She is normal weight. She is not ill-appearing.  HENT:     Head: Normocephalic.     Right Ear: Tympanic membrane normal.     Left Ear: Tympanic membrane normal.     Nose: Nose normal.     Mouth/Throat:     Mouth: Mucous membranes are moist.  Eyes:     Extraocular Movements: Extraocular movements intact.  Pupils: Pupils are equal, round, and reactive to light.  Cardiovascular:     Rate and Rhythm: Normal rate and regular rhythm.  Pulmonary:     Effort: Pulmonary effort is normal.     Breath sounds: Normal breath sounds.  Abdominal:     General: Abdomen is flat. Bowel sounds are normal.     Palpations: Abdomen is soft.     Tenderness: There is no guarding or rebound.  Musculoskeletal:        General: Normal range of motion.     Cervical back: Normal range of motion.  Skin:    General: Skin is warm.     Capillary Refill: Capillary refill takes less than 2 seconds.  Neurological:     General: No focal deficit present.     Mental Status: She is alert.  Psychiatric:        Mood and Affect: Mood normal.        Behavior: Behavior normal.        Thought Content: Thought content normal.        Judgment: Judgment normal.          Assessment & Plan:  Primary insomnia Assessment & Plan: Trazodone  refill 50 mg nightly   Orders: -     traZODone HCl; Take 0.5-1 tablets (25-50 mg total) by mouth at bedtime as needed for sleep.  Dispense: 30 tablet; Refill: 0  Seasonal allergic rhinitis due to pollen -     Fluticasone Propionate; Place 2 sprays into both nostrils daily.  Dispense: 1 g; Refill: 11  Encounter for general adult medical examination without abnormal findings Assessment & Plan: Patient Counseling(The following topics were reviewed):  Preventative care handout given to pt  Health maintenance and immunizations reviewed. Please refer to Health maintenance section. Pt advised on safe sex, wearing seatbelts in car, and proper nutrition labwork ordered today for annual Dental health: Discussed importance of regular tooth brushing, flossing, and dental visits.   Orders: -     Lipid panel -     VITAMIN D 25 Hydroxy (Vit-D Deficiency, Fractures) -     Basic metabolic panel -     CBC -     Hemoglobin A1c  History of hyperglycemia -     Basic metabolic panel -     Hemoglobin A1c  Elevated LDL cholesterol level Assessment & Plan: Ordered lipid panel, pending results. Work on low cholesterol diet and exercise as tolerated   Orders: -     Lipid panel  Vitamin D deficiency -     VITAMIN D 25 Hydroxy (Vit-D Deficiency, Fractures)  Vitamin B12 deficiency -     Vitamin B12  Iron deficiency anemia during pregnancy Assessment & Plan: Cbc today pending results.       Follow-up: Return in about 1 year (around 09/04/2024) for f/u CPE.   Mort Sawyers, FNP

## 2023-09-05 NOTE — Assessment & Plan Note (Signed)
Trazodone refill 50 mg nightly

## 2023-09-05 NOTE — Assessment & Plan Note (Signed)
Ordered lipid panel, pending results. Work on low cholesterol diet and exercise as tolerated  

## 2023-09-28 ENCOUNTER — Other Ambulatory Visit: Payer: Self-pay | Admitting: Family

## 2023-09-28 DIAGNOSIS — F5101 Primary insomnia: Secondary | ICD-10-CM

## 2023-09-29 ENCOUNTER — Other Ambulatory Visit (HOSPITAL_COMMUNITY)
Admission: RE | Admit: 2023-09-29 | Discharge: 2023-09-29 | Disposition: A | Discharge status: Home / Self Care | Payer: 59 | Source: Ambulatory Visit | Attending: Radiology | Admitting: Radiology

## 2023-09-29 ENCOUNTER — Ambulatory Visit (INDEPENDENT_AMBULATORY_CARE_PROVIDER_SITE_OTHER): Payer: 59 | Admitting: Radiology

## 2023-09-29 ENCOUNTER — Encounter: Payer: Self-pay | Admitting: Radiology

## 2023-09-29 VITALS — BP 116/58 | HR 90 | Ht 64.5 in | Wt 143.0 lb

## 2023-09-29 DIAGNOSIS — Z01419 Encounter for gynecological examination (general) (routine) without abnormal findings: Secondary | ICD-10-CM

## 2023-09-29 DIAGNOSIS — Z975 Presence of (intrauterine) contraceptive device: Secondary | ICD-10-CM | POA: Diagnosis not present

## 2023-09-29 NOTE — Patient Instructions (Signed)

## 2023-09-29 NOTE — Progress Notes (Signed)
Brittany [name] [birth year] [id number]   History:  38 y.o. G2P2 presents for annual exam. Doing well, no gyn concerns.  Gynecologic History No LMP recorded. (Menstrual status: IUD).   Contraception/Family planning: IUD placed 08/2022 Sexually active: yes Last Pap: 2021. Results were: normal   Obstetric History OB History  Gravida Para Term Preterm AB Living  2 2 2   2   SAB IAB Ectopic Multiple Live Births     0 2    # Outcome Date GA Lbr Len/2nd Weight Sex Type Anes PTL Lv  2 Term 10/03/15 [redacted]w[redacted]d 09:19 / 00:17 6 lb 0.6 oz (2.739 kg) M Vag-Spont EPI  LIV  1 Term 2011 [redacted]w[redacted]d  6 lb 15 oz (3.147 kg) M Vag-Spont EPI  LIV    The following portions of the patient's history were reviewed and updated as appropriate: allergies, current medications, past family history, past medical history, past social history, past surgical history, and problem list.  Review of Systems  All other systems reviewed and are negative.   Past medical history, past surgical history, family history and social history were all reviewed and documented in the EPIC chart.  Exam:  Vitals:   09/29/23 1518  BP: (!) 116/58  Pulse: 90  SpO2: 96%  Weight: 143 lb (64.9 kg)  Height: 5' 4.5" (1.638 m)   Body mass index is 24.17 kg/m.  Physical Exam Vitals and nursing note reviewed. Exam conducted with a chaperone present.  Constitutional:      Appearance: Normal appearance. She is normal weight.  HENT:     Head: Normocephalic and atraumatic.  Neck:     Thyroid: No thyroid mass, thyromegaly or thyroid tenderness.  Cardiovascular:     Rate and Rhythm: Regular rhythm.     Heart sounds: Normal heart sounds.  Pulmonary:     Effort: Pulmonary effort is normal.     Breath sounds: Normal breath sounds.  Chest:  Breasts:    Breasts are symmetrical.     Right: Normal. No inverted nipple, mass, nipple discharge, skin change or tenderness.     Left: Normal. No inverted nipple, mass, nipple discharge, skin change  or tenderness.  Abdominal:     General: Abdomen is flat. Bowel sounds are normal.     Palpations: Abdomen is soft.  Genitourinary:    General: Normal vulva.     Vagina: Normal. No vaginal discharge, bleeding or lesions.     Cervix: Normal. No discharge or lesion.     Uterus: Normal. Not enlarged and not tender.      Adnexa: Right adnexa normal and left adnexa normal.       Right: No mass, tenderness or fullness.         Left: No mass, tenderness or fullness.       Comments: IUD strings seen on exam  Lymphadenopathy:     Upper Body:     Right upper body: No axillary adenopathy.     Left upper body: No axillary adenopathy.  Skin:    General: Skin is warm and dry.  Neurological:     Mental Status: She is alert and oriented to person, place, and time.  Psychiatric:        Mood and Affect: Mood normal.        Thought Content: Thought content normal.        Judgment: Judgment normal.      Raynelle Fanning, CMA present for exam  Assessment/Plan:   1. Well female exam with routine  gynecological exam (Primary) - Mammogram  at 89 - Labs with PCP - Cytology - PAP( [place])  2. IUD (intrauterine device) in place - May remain until 08/2030    Discussed SBE, colonoscopy and DEXA screening as directed/appropriate. Recommend of exercise weekly, including weight bearing exercise. Encouraged the use of seatbelts and sunscreen.  Return in about 1 year (around 09/28/2024) for Annual.  Tanda Rockers [facility]NP-BC 3:43 PM 09/29/2023

## 2023-10-05 LAB — CYTOLOGY - PAP
Adequacy: ABSENT
Comment: NEGATIVE
Diagnosis: NEGATIVE
High risk HPV: NEGATIVE

## 2023-10-06 ENCOUNTER — Encounter: Payer: Self-pay | Admitting: Nurse Practitioner

## 2023-10-26 ENCOUNTER — Ambulatory Visit: Payer: 59 | Admitting: Family

## 2023-11-21 ENCOUNTER — Other Ambulatory Visit: Payer: Self-pay | Admitting: Family

## 2023-11-21 DIAGNOSIS — E559 Vitamin D deficiency, unspecified: Secondary | ICD-10-CM

## 2023-12-08 ENCOUNTER — Encounter: Payer: Self-pay | Admitting: Family

## 2023-12-08 ENCOUNTER — Ambulatory Visit: Admitting: Family

## 2023-12-08 VITALS — BP 100/62 | HR 74 | Temp 98.5°F | Ht 64.5 in | Wt 147.2 lb

## 2023-12-08 DIAGNOSIS — L249 Irritant contact dermatitis, unspecified cause: Secondary | ICD-10-CM | POA: Insufficient documentation

## 2023-12-08 MED ORDER — METRONIDAZOLE 0.75 % EX CREA: TOPICAL_CREAM | Freq: Two times a day (BID) | CUTANEOUS | 0 refills | Status: DC

## 2023-12-08 MED ORDER — PREDNISONE 10 MG (21) PO TBPK: ORAL_TABLET | ORAL | 0 refills | Status: DC

## 2023-12-08 NOTE — Progress Notes (Signed)
   Established Patient Office Visit  Subjective:   Patient ID: Brittany [name], female    DOB: 10/25/1985  Age: 38 y.o. MRN: [id number]  CC:  Chief Complaint  Patient presents with   Facial Breakout    HPI: [name] is a 38 y.o. female presenting on 12/08/2023 for Facial Breakout  Has noticed her face feels 'broken out' and is starting to have a burning sensation. This started about one month ago (the red spots) but is now burning over the last one week.   She does wear a mask at work but this has been the same type of masks since years ago.   She does take zyrtec and flonase daily for allergies, no new vitamins or medications.        ROS: Negative unless specifically indicated above in HPI.   Relevant past medical history reviewed and updated as indicated.   Allergies and medications reviewed and updated.   Current Outpatient Medications:    cetirizine (ZYRTEC) 10 MG tablet, Take 10 mg by mouth daily., Disp: , Rfl:    Cholecalciferol (VITAMIN D) 50 MCG (2000 UT) CAPS, Take 2,000 Units by mouth daily., Disp: , Rfl:    Cholecalciferol 1.25 MG (50000 UT) TABS, Take 1 tablet by mouth once a week., Disp: 12 tablet, Rfl: 0   Cobalamin Combinations (B-12) 1000-400 MCG SUBL, Place 1,000 Units under the tongue daily., Disp: , Rfl:    fluticasone (FLONASE) 50 MCG/ACT nasal spray, Place 2 sprays into both nostrils daily., Disp: 1 g, Rfl: 11   metroNIDAZOLE (METROCREAM) 0.75 % cream, Apply topically 2 (two) times daily., Disp: 45 g, Rfl: 0   olopatadine (PATANOL) 0.1 % ophthalmic solution, 1 drop 2 (two) times daily., Disp: , Rfl:    predniSONE (STERAPRED UNI-PAK 21 TAB) 10 MG (21) TBPK tablet, Take as directed, Disp: 1 each, Rfl: 0   traZODone (DESYREL) 50 MG tablet, TAKE 0.5-1 TABLETS BY MOUTH AT BEDTIME AS NEEDED FOR SLEEP., Disp: 90 tablet, Rfl: 1  Current Facility-Administered Medications:    levonorgestrel (MIRENA) 20 MCG/DAY IUD 1 each, 1 each, Intrauterine, Once,  Chrzanowski, Jami B, NP  No Known Allergies  Objective:   BP 100/62 (BP Location: Right Arm, Patient Position: Sitting, Cuff Size: Normal)   Pulse 74   Temp 98.5 F (36.9 C) (Temporal)   Ht 5' 4.5" (1.638 m)   Wt 147 lb 3.2 oz (66.8 kg)   SpO2 98%   BMI 24.88 kg/m    Physical Exam Skin:    Findings: Rash present. Rash is papular.     Comments: Actinic appearance with erythema along cheek bones  Small multiple scattered papules below nares and at chin and two spots on anterior neck     Assessment & Plan:  Irritant contact dermatitis, unspecified trigger Assessment & Plan: Suspect irritation vs rosacea  Rx prednisone pack take as directed Continue zyrtec  Rx metronidazole cream .75% twice daily   Look at cosmetics, new foods or vitamins, shampoo conditioner etc and try to rule out causative agent. Make sure no retinoids in cosmetics since using daytime.   Orders: -     predniSONE; Take as directed  Dispense: 1 each; Refill: 0 -     metroNIDAZOLE; Apply topically 2 (two) times daily.  Dispense: 45 g; Refill: 0     Follow up plan: Return if symptoms worsen or fail to improve.  Mort Sawyers, FNP

## 2023-12-08 NOTE — Assessment & Plan Note (Signed)
 Suspect irritation vs rosacea  Rx prednisone pack take as directed Continue zyrtec  Rx metronidazole cream .75% twice daily   Look at cosmetics, new foods or vitamins, shampoo conditioner etc and try to rule out causative agent. Make sure no retinoids in cosmetics since using daytime.

## 2023-12-29 ENCOUNTER — Ambulatory Visit: Admitting: Family

## 2023-12-29 ENCOUNTER — Encounter: Payer: Self-pay | Admitting: Family

## 2023-12-29 VITALS — BP 122/80 | HR 64 | Temp 98.4°F | Ht 64.5 in | Wt 147.4 lb

## 2023-12-29 DIAGNOSIS — N921 Excessive and frequent menstruation with irregular cycle: Secondary | ICD-10-CM | POA: Diagnosis not present

## 2023-12-29 DIAGNOSIS — R21 Rash and other nonspecific skin eruption: Secondary | ICD-10-CM | POA: Diagnosis not present

## 2023-12-29 DIAGNOSIS — N644 Mastodynia: Secondary | ICD-10-CM | POA: Diagnosis not present

## 2023-12-29 DIAGNOSIS — L700 Acne vulgaris: Secondary | ICD-10-CM | POA: Diagnosis not present

## 2023-12-29 LAB — CBC WITH DIFFERENTIAL/PLATELET
Basophils Absolute: 0 10*3/uL (ref 0.0–0.1)
Basophils Relative: 0.5 % (ref 0.0–3.0)
Eosinophils Absolute: 0.1 10*3/uL (ref 0.0–0.7)
Eosinophils Relative: 2.2 % (ref 0.0–5.0)
HCT: 41 % (ref 36.0–46.0)
Hemoglobin: 13.9 g/dL (ref 12.0–15.0)
Lymphocytes Relative: 33.4 % (ref 12.0–46.0)
Lymphs Abs: 1.8 10*3/uL (ref 0.7–4.0)
MCHC: 33.8 g/dL (ref 30.0–36.0)
MCV: 84.4 fl (ref 78.0–100.0)
Monocytes Absolute: 0.4 10*3/uL (ref 0.1–1.0)
Monocytes Relative: 6.9 % (ref 3.0–12.0)
Neutro Abs: 3.1 10*3/uL (ref 1.4–7.7)
Neutrophils Relative %: 57 % (ref 43.0–77.0)
Platelets: 178 10*3/uL (ref 150.0–400.0)
RBC: 4.86 Mil/uL (ref 3.87–5.11)
RDW: 13.7 % (ref 11.5–15.5)
WBC: 5.5 10*3/uL (ref 4.0–10.5)

## 2023-12-29 LAB — TSH: TSH: 1.12 u[IU]/mL (ref 0.35–5.50)

## 2023-12-29 LAB — FOLLICLE STIMULATING HORMONE: FSH: 3.3 m[IU]/mL

## 2023-12-29 LAB — SEDIMENTATION RATE: Sed Rate: 12 mm/h (ref 0–20)

## 2023-12-29 LAB — C-REACTIVE PROTEIN: CRP: <1 mg/dL (ref 0.5–20.0)

## 2023-12-29 MED ORDER — PREDNISONE 10 MG (21) PO TBPK: ORAL_TABLET | ORAL | 0 refills | Status: DC

## 2023-12-29 MED ORDER — DOXYCYCLINE HYCLATE 100 MG PO TABS: 100.0000 mg | ORAL_TABLET | Freq: Every day | ORAL | 0 refills | Status: AC

## 2023-12-29 NOTE — Progress Notes (Signed)
 Established Patient Office Visit  Subjective:   Patient ID: Brittany [name], female    DOB: 30-May-1986  Age: 38 y.o. MRN: [id number]  CC:  Chief Complaint  Patient presents with   Rash    HPI: Brittany [name] is a 38 y.o. female presenting on 12/29/2023 for Rash  H/o irregular menses and heavy periods prior to IUD. She does state still very painful during her expected 'cycle'. No bleeding. She does have acne.   Facial rash, still prevalent there was some improvement with prednisone  pack, metronidazole  was not helpful in fact made it worse. It is not itchy. It does feel warm to the touch at times. She does state that the rash is coming up on her chin and it painful at times, she did 'pop' one and some clear fluid came out.        ROS: Negative unless specifically indicated above in HPI.   Relevant past medical history reviewed and updated as indicated.   Allergies and medications reviewed and updated.   Current Outpatient Medications:    cetirizine (ZYRTEC) 10 MG tablet, Take 10 mg by mouth daily., Disp: , Rfl:    Cholecalciferol  (VITAMIN D ) 50 MCG (2000 UT) CAPS, Take 2,000 Units by mouth daily., Disp: , Rfl:    Cholecalciferol  1.25 MG (50000 UT) TABS, Take 1 tablet by mouth once a week., Disp: 12 tablet, Rfl: 0   Cobalamin Combinations (B-12) 1000-400 MCG SUBL, Place 1,000 Units under the tongue daily., Disp: , Rfl:    doxycycline  (VIBRA -TABS) 100 MG tablet, Take 1 tablet (100 mg total) by mouth daily for 14 days., Disp: 14 tablet, Rfl: 0   fluticasone  (FLONASE ) 50 MCG/ACT nasal spray, Place 2 sprays into both nostrils daily., Disp: 1 g, Rfl: 11   olopatadine (PATANOL) 0.1 % ophthalmic solution, 1 drop 2 (two) times daily., Disp: , Rfl:    predniSONE  (STERAPRED UNI-PAK 21 TAB) 10 MG (21) TBPK tablet, Take as directed, Disp: 1 each, Rfl: 0   traZODone  (DESYREL ) 50 MG tablet, TAKE 0.5-1 TABLETS BY MOUTH AT BEDTIME AS NEEDED FOR SLEEP., Disp: 90 tablet, Rfl: 1  Current  Facility-Administered Medications:    levonorgestrel  (MIRENA ) 20 MCG/DAY IUD 1 each, 1 each, Intrauterine, Once, Chrzanowski, Jami B, NP  No Known Allergies  Objective:   BP 122/80 (BP Location: Left Arm, Patient Position: Sitting, Cuff Size: Normal)   Pulse 64   Temp 98.4 F (36.9 C) (Temporal)   Ht 5' 4.5" (1.638 m)   Wt 147 lb 6.4 oz (66.9 kg)   SpO2 99%   BMI 24.91 kg/m    Physical Exam Constitutional:      General: She is not in acute distress.    Appearance: Normal appearance. She is normal weight. She is not ill-appearing, toxic-appearing or diaphoretic.  HENT:     Head: Normocephalic.  Cardiovascular:     Rate and Rhythm: Normal rate.  Pulmonary:     Effort: Pulmonary effort is normal.  Musculoskeletal:        General: Normal range of motion.  Skin:    Comments: Cheeks with raised red erythematic papules with slight erythema chin     Neurological:     General: No focal deficit present.     Mental Status: She is alert and oriented to person, place, and time. Mental status is at baseline.  Psychiatric:        Mood and Affect: Mood normal.        Behavior: Behavior normal.  Thought Content: Thought content normal.        Judgment: Judgment normal.      Assessment & Plan:  Breast tenderness in female -     Sedimentation rate -     ANA w/Reflex -     C-reactive protein -     CBC with Differential/Platelet  Acne vulgaris Assessment & Plan: Trial doxycycline  100 mg once daily x 2 weeks if there is no improvement with additional prednisone  pack.   Orders: -     Sedimentation rate -     ANA w/Reflex -     C-reactive protein -     CBC with Differential/Platelet -     Doxycycline  Hyclate; Take 1 tablet (100 mg total) by mouth daily for 14 days.  Dispense: 14 tablet; Refill: 0  Menorrhagia with irregular cycle Assessment & Plan: Improved with IUD however still with painful periods.   Pt requesting hormonal labs, however reasonable as pt with  possible acne   Orders: -     Follicle stimulating hormone -     TSH -     Prolactin -     Testos,Total,Free and SHBG (Female)  Facial rash Assessment & Plan: Repeat prednisone  pack  Referral derm in case no improvement.  Testing for ANA reflex to r/o lupus  Will treat with doxycycline  for possible acne if no improvement with prednisone .  Failure metronidazole .   Orders: -     Sedimentation rate -     ANA w/Reflex -     C-reactive protein -     CBC with Differential/Platelet -     Ambulatory referral to Dermatology -     predniSONE ; Take as directed  Dispense: 1 each; Refill: 0     Follow up plan: Return if symptoms worsen or fail to improve.  Felicita Horns, FNP

## 2023-12-29 NOTE — Assessment & Plan Note (Signed)
 Repeat prednisone  pack  Referral derm in case no improvement.  Testing for ANA reflex to r/o lupus  Will treat with doxycycline  for possible acne if no improvement with prednisone .  Failure metronidazole .

## 2023-12-29 NOTE — Assessment & Plan Note (Signed)
 Improved with IUD however still with painful periods.   Pt requesting hormonal labs, however reasonable as pt with possible acne

## 2023-12-29 NOTE — Assessment & Plan Note (Signed)
 Trial doxycycline  100 mg once daily x 2 weeks if there is no improvement with additional prednisone  pack.

## 2023-12-30 ENCOUNTER — Encounter: Payer: Self-pay | Admitting: Family

## 2023-12-30 LAB — ANA W/REFLEX: Anti Nuclear Antibody (ANA): NEGATIVE

## 2024-01-03 ENCOUNTER — Encounter: Payer: Self-pay | Admitting: Family

## 2024-01-03 DIAGNOSIS — R21 Rash and other nonspecific skin eruption: Secondary | ICD-10-CM

## 2024-01-03 DIAGNOSIS — L700 Acne vulgaris: Secondary | ICD-10-CM

## 2024-01-03 LAB — TESTOS,TOTAL,FREE AND SHBG (FEMALE)
Free Testosterone: 4.9 pg/mL (ref 0.1–6.4)
Sex Hormone Binding: 38 nmol/L (ref 17–124)
Testosterone, Total, LC-MS-MS: 36 ng/dL (ref 2–45)

## 2024-01-03 LAB — PROLACTIN: Prolactin: 10.6 ng/mL

## 2024-01-05 ENCOUNTER — Telehealth: Payer: Self-pay | Admitting: Family

## 2024-01-05 ENCOUNTER — Ambulatory Visit

## 2024-01-05 ENCOUNTER — Encounter: Payer: Self-pay | Admitting: *Deleted

## 2024-01-05 NOTE — Telephone Encounter (Signed)
 Copied from CRM [id number]. Topic: Clinical - Medical Advice >> Jan 05, 2024  9:40 AM Ovid Blow wrote: Reason for CRM: Patient stated that she is going back to school in August and needs vaccination paperwork. Needs advice from Nurse on what to do

## 2024-01-05 NOTE — Telephone Encounter (Signed)
 Spoke with pt over the phone. She has 2 profiles on the state registry for vaccines, one with her maiden name and one with her married name. These 2 profiles have been merged. Nothing further was needed at this time.

## 2024-01-06 ENCOUNTER — Encounter: Payer: Self-pay | Admitting: Family

## 2024-03-19 ENCOUNTER — Ambulatory Visit: Admitting: Family

## 2024-04-10 ENCOUNTER — Ambulatory Visit: Admitting: Family

## 2024-04-10 ENCOUNTER — Encounter: Payer: Self-pay | Admitting: Family

## 2024-04-10 VITALS — BP 110/70 | HR 78 | Temp 98.7°F | Ht 64.5 in | Wt 148.0 lb

## 2024-04-10 DIAGNOSIS — J301 Allergic rhinitis due to pollen: Secondary | ICD-10-CM | POA: Diagnosis not present

## 2024-04-10 DIAGNOSIS — M5442 Lumbago with sciatica, left side: Secondary | ICD-10-CM | POA: Diagnosis not present

## 2024-04-10 MED ORDER — TIZANIDINE HCL 4 MG PO TABS: ORAL_TABLET | ORAL | 0 refills | Status: DC

## 2024-04-10 MED ORDER — PREDNISONE 10 MG (21) PO TBPK: ORAL_TABLET | ORAL | 0 refills | Status: DC

## 2024-04-10 MED ORDER — AZELASTINE HCL 0.1 % NA SOLN: 2.0000 | Freq: Two times a day (BID) | NASAL | 12 refills | Status: AC

## 2024-04-10 NOTE — Assessment & Plan Note (Signed)
 Muscle relaxer tizanidine  prescribed Prednisone  pack  Stretching exercises discussed Heat to site.  Massage as tolerated F/u prn

## 2024-04-10 NOTE — Assessment & Plan Note (Signed)
 Start astelin  flonase   Can continue zyrtec but no proven added benefit.  Nasal saline prior to nose spray  Could consider singulair

## 2024-04-10 NOTE — Progress Notes (Signed)
 Established Patient Office Visit  Subjective:   Patient ID: Brittany [name], female    DOB: [birth year]  Age: 38 y.o. MRN: [id number]  CC:  Chief Complaint  Patient presents with   Hip Pain    L sided x4 days.    HPI: Brittany [name] is a 38 y.o. female presenting on 04/10/2024 for Hip Pain (L sided x4 days.)  Left sided hip pain, in the left lower buttocks area. Worse with lying down. Started four days ago and not sure of the cause. No recent exercise. Not lifting anything out of the normal. Slight radiation of pain down the leg when she lies on this. Took some ibuprofen  with slight improvement.   Still ongoing nasal congestion, easier for her to breath out of her mouth and not her nose. No sneezing. Yes to pnd. No ear pain or sore throat. No itchy watery eyes. She uses flonase  daily but she states it doesn't feel as though it is getting very far, and feels it is just going in and leaking down her throat. She also takes zyrtec has been for a few months.        ROS: Negative unless specifically indicated above in HPI.   Relevant past medical history reviewed and updated as indicated.   Allergies and medications reviewed and updated.   Current Outpatient Medications:    azelastine  (ASTELIN ) 0.1 % nasal spray, Place 2 sprays into both nostrils 2 (two) times daily. Use in each nostril as directed, Disp: 30 mL, Rfl: 12   cetirizine (ZYRTEC) 10 MG tablet, Take 10 mg by mouth daily., Disp: , Rfl:    Cholecalciferol  (VITAMIN D ) 50 MCG (2000 UT) CAPS, Take 2,000 Units by mouth daily., Disp: , Rfl:    Cholecalciferol  1.25 MG (50000 UT) TABS, Take 1 tablet by mouth once a week., Disp: 12 tablet, Rfl: 0   Cobalamin Combinations (B-12) 1000-400 MCG SUBL, Place 1,000 Units under the tongue daily., Disp: , Rfl:    fluticasone  (FLONASE ) 50 MCG/ACT nasal spray, Place 2 sprays into both nostrils daily., Disp: 1 g, Rfl: 11   olopatadine (PATANOL) 0.1 % ophthalmic solution, 1 drop 2 (two) times  daily., Disp: , Rfl:    predniSONE  (STERAPRED UNI-PAK 21 TAB) 10 MG (21) TBPK tablet, Take as directed, Disp: 1 each, Rfl: 0   tiZANidine  (ZANAFLEX ) 4 MG tablet, 1/e to one tablet po at bedtime prn muscle spasm, Disp: 30 tablet, Rfl: 0   traZODone  (DESYREL ) 50 MG tablet, TAKE 0.5-1 TABLETS BY MOUTH AT BEDTIME AS NEEDED FOR SLEEP., Disp: 90 tablet, Rfl: 1  Current Facility-Administered Medications:    levonorgestrel  (MIRENA ) 20 MCG/DAY IUD 1 each, 1 each, Intrauterine, Once, Chrzanowski, Jami B, NP  No Known Allergies  Objective:   BP 110/70 (BP Location: Right Arm, Patient Position: Sitting, Cuff Size: Normal)   Pulse 78   Temp 98.7 F (37.1 C) (Temporal)   Ht 5' 4.5 (1.638 m)   Wt 148 lb (67.1 kg)   SpO2 100%   BMI 25.01 kg/m    Physical Exam Constitutional:      General: She is not in acute distress.    Appearance: Normal appearance. She is normal weight. She is not ill-appearing, toxic-appearing or diaphoretic.  HENT:     Head: Normocephalic.     Right Ear: Tympanic membrane normal.     Left Ear: Tympanic membrane normal.     Nose: Nose normal.     Right Turbinates: Enlarged, swollen and pale.  Mouth/Throat:     Mouth: Mucous membranes are dry.     Pharynx: No oropharyngeal exudate or posterior oropharyngeal erythema.  Eyes:     Extraocular Movements: Extraocular movements intact.     Pupils: Pupils are equal, round, and reactive to light.  Cardiovascular:     Rate and Rhythm: Normal rate and regular rhythm.     Pulses: Normal pulses.     Heart sounds: Normal heart sounds.  Pulmonary:     Effort: Pulmonary effort is normal.     Breath sounds: Normal breath sounds.  Musculoskeletal:     Cervical back: Normal range of motion.     Lumbar back: No bony tenderness. Decreased range of motion. Positive left straight leg raise test.  Neurological:     General: No focal deficit present.     Mental Status: She is alert and oriented to person, place, and time. Mental  status is at baseline.  Psychiatric:        Mood and Affect: Mood normal.        Behavior: Behavior normal.        Thought Content: Thought content normal.        Judgment: Judgment normal.     Assessment & Plan:  Acute left-sided low back pain with left-sided sciatica Assessment & Plan: Muscle relaxer tizanidine  prescribed Prednisone  pack  Stretching exercises discussed Heat to site.  Massage as tolerated F/u prn   Orders: -     predniSONE ; Take as directed  Dispense: 1 each; Refill: 0 -     tiZANidine  HCl; 1/e to one tablet po at bedtime prn muscle spasm  Dispense: 30 tablet; Refill: 0  Seasonal allergic rhinitis due to pollen Assessment & Plan: Start astelin  flonase   Can continue zyrtec but no proven added benefit.  Nasal saline prior to nose spray  Could consider singulair  Orders: -     Azelastine  HCl; Place 2 sprays into both nostrils 2 (two) times daily. Use in each nostril as directed  Dispense: 30 mL; Refill: 12     Follow up plan: Return if symptoms worsen or fail to improve.  Ginger Patrick, FNP

## 2024-05-02 ENCOUNTER — Other Ambulatory Visit: Payer: Self-pay | Admitting: Family

## 2024-05-02 DIAGNOSIS — M5442 Lumbago with sciatica, left side: Secondary | ICD-10-CM

## 2024-05-22 ENCOUNTER — Ambulatory Visit: Admitting: Family

## 2024-05-22 VITALS — BP 124/78 | HR 86 | Temp 98.5°F | Ht 64.5 in | Wt 153.2 lb

## 2024-05-22 DIAGNOSIS — J0100 Acute maxillary sinusitis, unspecified: Secondary | ICD-10-CM

## 2024-05-22 DIAGNOSIS — Z23 Encounter for immunization: Secondary | ICD-10-CM | POA: Diagnosis not present

## 2024-05-22 DIAGNOSIS — H9193 Unspecified hearing loss, bilateral: Secondary | ICD-10-CM

## 2024-05-22 MED ORDER — AMOXICILLIN-POT CLAVULANATE 875-125 MG PO TABS: 1.0000 | ORAL_TABLET | Freq: Two times a day (BID) | ORAL | 0 refills | Status: AC

## 2024-05-22 NOTE — Progress Notes (Signed)
 Established Patient Office Visit  Subjective:      CC:  Chief Complaint  Patient presents with   Acute Visit    Feels like she has fluid in her ears. Was seen at urgent care last week but doesn't feel any better.    HPI: Brittany [name] is a 38 y.o. female presenting on 05/22/2024 for Acute Visit (Feels like she has fluid in her ears. Was seen at urgent care last week but doesn't feel any better.) .  Discussed the use of AI scribe software for clinical note transcription with the patient, who gave verbal consent to proceed.  History of Present Illness A 38 year old female presents with decreased hearing and ear fullness.  She experiences a sensation of fluid in the back of her ears, which she can feel moving when tilting her head back. This is associated with a feeling of fullness and decreased hearing, particularly affecting her ability to hear from the front. The sensation is described as similar to being underwater.  Initially, symptoms were present in the left ear and then progressed to involve both ears, with the left ear remaining more affected. On Thursday, she was unable to hear anything, impacting her work as she felt she had to yell to communicate.  No sore throat, sinus pressure, wheezing, tinnitus, or buzzing. She reports postnasal drip and drainage down her throat. She has not been consistent with her use of Flonase  and Astelin , which she had stopped to see if it would help her symptoms.  She was previously seen at urgent care for left ear pain and decreased hearing, and was prescribed prednisone  and Augmentin . No significant improvement with these medications, although a slight improvement in hearing with Augmentin  was noted. She is currently completing a seven-day course of Augmentin .         Social history:  Relevant past medical, surgical, family and social history reviewed and updated as indicated. Interim medical history since our last visit  reviewed.  Allergies and medications reviewed and updated.  DATA REVIEWED: CHART IN EPIC     ROS: Negative unless specifically indicated above in HPI.    Current Outpatient Medications:    amoxicillin -clavulanate (AUGMENTIN ) 875-125 MG tablet, Take 1 tablet by mouth 2 (two) times daily for 3 days., Disp: 6 tablet, Rfl: 0   azelastine  (ASTELIN ) 0.1 % nasal spray, Place 2 sprays into both nostrils 2 (two) times daily. Use in each nostril as directed, Disp: 30 mL, Rfl: 12   cetirizine (ZYRTEC) 10 MG tablet, Take 10 mg by mouth daily., Disp: , Rfl:    Cholecalciferol  (VITAMIN D ) 50 MCG (2000 UT) CAPS, Take 2,000 Units by mouth daily., Disp: , Rfl:    Cholecalciferol  1.25 MG (50000 UT) TABS, Take 1 tablet by mouth once a week., Disp: 12 tablet, Rfl: 0   Cobalamin Combinations (B-12) 1000-400 MCG SUBL, Place 1,000 Units under the tongue daily., Disp: , Rfl:    fluticasone  (FLONASE ) 50 MCG/ACT nasal spray, Place 2 sprays into both nostrils daily., Disp: 1 g, Rfl: 11   olopatadine (PATANOL) 0.1 % ophthalmic solution, 1 drop 2 (two) times daily., Disp: , Rfl:    tiZANidine  (ZANAFLEX ) 4 MG tablet, 1/2 TO ONE TABLET BY MOUTH AT BEDTIME AS NEEDED FOR MUSCLE SPASM, Disp: 90 tablet, Rfl: 1   traZODone  (DESYREL ) 50 MG tablet, TAKE 0.5-1 TABLETS BY MOUTH AT BEDTIME AS NEEDED FOR SLEEP., Disp: 90 tablet, Rfl: 1  Current Facility-Administered Medications:    levonorgestrel  (MIRENA ) 20 MCG/DAY  IUD 1 each, 1 each, Intrauterine, Once, Chrzanowski, Jami B, NP        Objective:        BP 124/78 (BP Location: Left Arm, Patient Position: Sitting, Cuff Size: Normal)   Pulse 86   Temp 98.5 F (36.9 C) (Temporal)   Ht 5' 4.5 (1.638 m)   Wt 153 lb 3.2 oz (69.5 kg)   SpO2 99%   BMI 25.89 kg/m   Physical Exam HEENT: Tympanic membranes without erythema or bulging. Mild sinus tenderness. Throat with drainage.  Wt Readings from Last 3 Encounters:  05/22/24 153 lb 3.2 oz (69.5 kg)  04/10/24 148 lb  (67.1 kg)  12/29/23 147 lb 6.4 oz (66.9 kg)    Physical Exam Vitals reviewed.  Constitutional:      General: She is not in acute distress.    Appearance: Normal appearance. She is normal weight. She is not ill-appearing, toxic-appearing or diaphoretic.  HENT:     Head: Normocephalic.     Right Ear: Tympanic membrane and ear canal normal.     Left Ear: Tympanic membrane and ear canal normal.     Ears:     Comments: Hearing impacted left side     Nose:     Right Turbinates: Swollen.     Left Turbinates: Swollen.     Right Sinus: Maxillary sinus tenderness present.     Left Sinus: Maxillary sinus tenderness present.     Mouth/Throat:     Mouth: Mucous membranes are dry.     Pharynx: No oropharyngeal exudate or posterior oropharyngeal erythema.  Eyes:     Extraocular Movements: Extraocular movements intact.     Pupils: Pupils are equal, round, and reactive to light.  Cardiovascular:     Rate and Rhythm: Normal rate and regular rhythm.     Pulses: Normal pulses.     Heart sounds: Normal heart sounds.  Pulmonary:     Effort: Pulmonary effort is normal.     Breath sounds: Normal breath sounds.  Musculoskeletal:     Cervical back: Normal range of motion.  Neurological:     General: No focal deficit present.     Mental Status: She is alert and oriented to person, place, and time. Mental status is at baseline.  Psychiatric:        Mood and Affect: Mood normal.        Behavior: Behavior normal.        Thought Content: Thought content normal.        Judgment: Judgment normal.          Results   Assessment & Plan:   Assessment and Plan Assessment & Plan Acute sinusitis with bilateral ear involvement and decreased hearing Acute sinusitis with bilateral ear involvement and decreased hearing, initially treated with prednisone  and Augmentin . Symptoms include fluid sensation in ears, decreased hearing, and sinus tenderness. Prednisone  provided no significant improvement, and  Augmentin  provided slight improvement. Symptoms started in the left ear and progressed to the right ear, with the left ear being more affected. No signs of tympanic membrane erythema or bulging. Differential includes fluid in the ear and sinusitis. ENT referral considered if symptoms persist. - Continue Augmentin  for a total of 10 days - Restart Astelin  nasal spray - Restart Flonase  nasal spray - Refer to ENT if symptoms do not resolve  Seasonal allergic rhinitis due to pollen Seasonal allergic rhinitis potentially contributing to postnasal drip and sinus symptoms. She had stopped using Flonase  and Astelin , which may  have exacerbated symptoms. - Restart Astelin  nasal spray - Restart Flonase  nasal spray  Recording duration: 6 minutes      Return if symptoms worsen or fail to improve.     Ginger Patrick, MSN, APRN, FNP-C [facility]

## 2024-05-22 NOTE — Addendum Note (Signed)
 Addended by: ALBINO SHAVER C on: 05/22/2024 10:40 AM   Modules accepted: Orders

## 2024-06-28 ENCOUNTER — Encounter (INDEPENDENT_AMBULATORY_CARE_PROVIDER_SITE_OTHER): Payer: Self-pay

## 2024-09-07 ENCOUNTER — Encounter: Payer: BC Managed Care – PPO | Admitting: Family

## 2024-09-20 ENCOUNTER — Ambulatory Visit: Admitting: Family

## 2024-10-02 ENCOUNTER — Ambulatory Visit: Payer: 59 | Admitting: Radiology

## 2024-10-25 ENCOUNTER — Ambulatory Visit: Admitting: Radiology

## 2025-02-15 ENCOUNTER — Encounter: Admitting: Family
